# Patient Record
Sex: Male | Born: 1950 | Race: White | Hispanic: No
Health system: Midwestern US, Community
[De-identification: ages and names within clinical notes are randomized; demographics above are authoritative.]

## PROBLEM LIST (undated history)

## (undated) DIAGNOSIS — R252 Cramp and spasm: Secondary | ICD-10-CM

## (undated) DIAGNOSIS — L299 Pruritus, unspecified: Secondary | ICD-10-CM

## (undated) DIAGNOSIS — F419 Anxiety disorder, unspecified: Principal | ICD-10-CM

## (undated) DIAGNOSIS — E538 Deficiency of other specified B group vitamins: Secondary | ICD-10-CM

## (undated) DIAGNOSIS — I10 Essential (primary) hypertension: Secondary | ICD-10-CM

## (undated) DIAGNOSIS — K50818 Crohn's disease of both small and large intestine with other complication: Principal | ICD-10-CM

## (undated) DIAGNOSIS — T148XXA Other injury of unspecified body region, initial encounter: Secondary | ICD-10-CM

## (undated) DIAGNOSIS — F5101 Primary insomnia: Principal | ICD-10-CM

## (undated) DIAGNOSIS — R319 Hematuria, unspecified: Secondary | ICD-10-CM

## (undated) DIAGNOSIS — Z125 Encounter for screening for malignant neoplasm of prostate: Secondary | ICD-10-CM

## (undated) DIAGNOSIS — G4700 Insomnia, unspecified: Secondary | ICD-10-CM

## (undated) DIAGNOSIS — M79672 Pain in left foot: Secondary | ICD-10-CM

## (undated) DIAGNOSIS — K5090 Crohn's disease, unspecified, without complications: Secondary | ICD-10-CM

---

## 1980-09-28 HISTORY — PX: OTHER SURGICAL HISTORY: SHX169

## 2013-09-28 HISTORY — PX: CHOLECYSTECTOMY: SHX55

## 2017-10-29 ENCOUNTER — Encounter (HOSPITAL_COMMUNITY): Payer: Self-pay | Admitting: *Deleted

## 2017-10-29 ENCOUNTER — Other Ambulatory Visit: Payer: Self-pay

## 2017-10-29 ENCOUNTER — Emergency Department (HOSPITAL_COMMUNITY): Payer: Medicare Other

## 2017-10-29 ENCOUNTER — Inpatient Hospital Stay (HOSPITAL_COMMUNITY)
Admission: EM | Admit: 2017-10-29 | Discharge: 2017-11-01 | DRG: 387 | Disposition: A | Discharge status: Home / Self Care | Payer: Medicare Other | Attending: Internal Medicine | Admitting: Internal Medicine

## 2017-10-29 ENCOUNTER — Inpatient Hospital Stay (HOSPITAL_COMMUNITY): Payer: Medicare Other

## 2017-10-29 DIAGNOSIS — K5090 Crohn's disease, unspecified, without complications: Secondary | ICD-10-CM | POA: Diagnosis not present

## 2017-10-29 DIAGNOSIS — Z9049 Acquired absence of other specified parts of digestive tract: Secondary | ICD-10-CM | POA: Diagnosis not present

## 2017-10-29 DIAGNOSIS — Z79899 Other long term (current) drug therapy: Secondary | ICD-10-CM | POA: Diagnosis not present

## 2017-10-29 DIAGNOSIS — E876 Hypokalemia: Secondary | ICD-10-CM | POA: Diagnosis present

## 2017-10-29 DIAGNOSIS — K50012 Crohn's disease of small intestine with intestinal obstruction: Secondary | ICD-10-CM | POA: Diagnosis not present

## 2017-10-29 DIAGNOSIS — K56600 Partial intestinal obstruction, unspecified as to cause: Secondary | ICD-10-CM | POA: Diagnosis not present

## 2017-10-29 DIAGNOSIS — J101 Influenza due to other identified influenza virus with other respiratory manifestations: Secondary | ICD-10-CM

## 2017-10-29 DIAGNOSIS — J069 Acute upper respiratory infection, unspecified: Secondary | ICD-10-CM

## 2017-10-29 DIAGNOSIS — I10 Essential (primary) hypertension: Secondary | ICD-10-CM | POA: Diagnosis present

## 2017-10-29 HISTORY — DX: Essential (primary) hypertension: I10

## 2017-10-29 HISTORY — DX: Crohn's disease, unspecified, without complications: K50.90

## 2017-10-29 LAB — CBC WITH DIFFERENTIAL/PLATELET
Basophils Absolute: 0 10*3/uL (ref 0.0–0.1)
Basophils Relative: 0 %
Eosinophils Absolute: 0.1 10*3/uL (ref 0.0–0.7)
Eosinophils Relative: 1 %
HCT: 37.8 % — ABNORMAL LOW (ref 39.0–52.0)
Hemoglobin: 13.3 g/dL (ref 13.0–17.0)
Lymphocytes Relative: 7 %
Lymphs Abs: 0.6 10*3/uL — ABNORMAL LOW (ref 0.7–4.0)
MCH: 32.4 pg (ref 26.0–34.0)
MCHC: 35.2 g/dL (ref 30.0–36.0)
MCV: 92 fL (ref 78.0–100.0)
Monocytes Absolute: 0.9 10*3/uL (ref 0.1–1.0)
Monocytes Relative: 11 %
Neutro Abs: 7.2 10*3/uL (ref 1.7–7.7)
Neutrophils Relative %: 81 %
Platelets: 258 10*3/uL (ref 150–400)
RBC: 4.11 MIL/uL — ABNORMAL LOW (ref 4.22–5.81)
RDW: 12.9 % (ref 11.5–15.5)
WBC: 8.8 10*3/uL (ref 4.0–10.5)

## 2017-10-29 LAB — COMPREHENSIVE METABOLIC PANEL
ALK PHOS: 56 U/L (ref 38–126)
ALT: 17 U/L (ref 17–63)
AST: 21 U/L (ref 15–41)
Albumin: 3.8 g/dL (ref 3.5–5.0)
Anion gap: 9 (ref 5–15)
BUN: 15 mg/dL (ref 6–20)
CALCIUM: 8.7 mg/dL — AB (ref 8.9–10.3)
CO2: 26 mmol/L (ref 22–32)
CREATININE: 0.79 mg/dL (ref 0.61–1.24)
Chloride: 105 mmol/L (ref 101–111)
Glucose, Bld: 130 mg/dL — ABNORMAL HIGH (ref 65–99)
Potassium: 3.3 mmol/L — ABNORMAL LOW (ref 3.5–5.1)
Sodium: 140 mmol/L (ref 135–145)
TOTAL PROTEIN: 6.8 g/dL (ref 6.5–8.1)
Total Bilirubin: 0.7 mg/dL (ref 0.3–1.2)

## 2017-10-29 LAB — RESPIRATORY PANEL BY PCR
ADENOVIRUS-RVPPCR: NOT DETECTED
Bordetella pertussis: NOT DETECTED
CHLAMYDOPHILA PNEUMONIAE-RVPPCR: NOT DETECTED
CORONAVIRUS OC43-RVPPCR: NOT DETECTED
Coronavirus 229E: NOT DETECTED
Coronavirus HKU1: NOT DETECTED
Coronavirus NL63: NOT DETECTED
INFLUENZA A-RVPPCR: UNDETERMINED — AB
INFLUENZA B-RVPPCR: NOT DETECTED
MYCOPLASMA PNEUMONIAE-RVPPCR: NOT DETECTED
Metapneumovirus: NOT DETECTED
PARAINFLUENZA VIRUS 1-RVPPCR: NOT DETECTED
PARAINFLUENZA VIRUS 4-RVPPCR: NOT DETECTED
Parainfluenza Virus 2: NOT DETECTED
Parainfluenza Virus 3: NOT DETECTED
RESPIRATORY SYNCYTIAL VIRUS-RVPPCR: NOT DETECTED
Rhinovirus / Enterovirus: NOT DETECTED

## 2017-10-29 LAB — PROCALCITONIN: Procalcitonin: <0.1 ng/mL

## 2017-10-29 LAB — HEMOGLOBIN A1C
HEMOGLOBIN A1C: 5.4 % (ref 4.8–5.6)
MEAN PLASMA GLUCOSE: 108.28 mg/dL

## 2017-10-29 LAB — MAGNESIUM: Magnesium: 1.6 mg/dL — ABNORMAL LOW (ref 1.7–2.4)

## 2017-10-29 LAB — I-STAT CG4 LACTIC ACID, ED: LACTIC ACID, VENOUS: 0.92 mmol/L (ref 0.5–1.9)

## 2017-10-29 LAB — LIPASE, BLOOD: LIPASE: 30 U/L (ref 11–51)

## 2017-10-29 LAB — GLUCOSE, CAPILLARY
GLUCOSE-CAPILLARY: 98 mg/dL (ref 65–99)
GLUCOSE-CAPILLARY: 107 mg/dL — AB (ref 65–99)

## 2017-10-29 MED ORDER — LEVALBUTEROL HCL 0.63 MG/3ML IN NEBU: 0.6300 mg | INHALATION_SOLUTION | Freq: Four times a day (QID) | RESPIRATORY_TRACT | Status: DC

## 2017-10-29 MED ORDER — SODIUM CHLORIDE 0.9 % IV BOLUS (SEPSIS)
1000.0000 mL | Freq: Once | INTRAVENOUS | Status: AC
  Administered 2017-10-29: 1000 mL via INTRAVENOUS

## 2017-10-29 MED ORDER — HYDROMORPHONE HCL 1 MG/ML IJ SOLN
1.0000 mg | Freq: Once | INTRAMUSCULAR | Status: AC
  Administered 2017-10-29: 1 mg via INTRAVENOUS
  Filled 2017-10-29: qty 1

## 2017-10-29 MED ORDER — METRONIDAZOLE IN NACL 5-0.79 MG/ML-% IV SOLN
500.0000 mg | Freq: Three times a day (TID) | INTRAVENOUS | Status: DC
  Administered 2017-10-29 – 2017-10-31 (×6): 500 mg via INTRAVENOUS
  Filled 2017-10-29 (×7): qty 100

## 2017-10-29 MED ORDER — ONDANSETRON HCL 4 MG/2ML IJ SOLN
4.0000 mg | Freq: Once | INTRAMUSCULAR | Status: AC
  Administered 2017-10-29: 4 mg via INTRAVENOUS
  Filled 2017-10-29: qty 2

## 2017-10-29 MED ORDER — INSULIN ASPART 100 UNIT/ML ~~LOC~~ SOLN: 0.0000 [IU] | Freq: Three times a day (TID) | SUBCUTANEOUS | Status: DC

## 2017-10-29 MED ORDER — ONDANSETRON HCL 4 MG/2ML IJ SOLN
4.0000 mg | Freq: Four times a day (QID) | INTRAMUSCULAR | Status: DC | PRN
  Administered 2017-10-29 – 2017-10-31 (×5): 4 mg via INTRAVENOUS
  Filled 2017-10-29 (×5): qty 2

## 2017-10-29 MED ORDER — METHYLPREDNISOLONE SODIUM SUCC 40 MG IJ SOLR
40.0000 mg | Freq: Two times a day (BID) | INTRAMUSCULAR | Status: DC
  Administered 2017-10-29 – 2017-10-31 (×4): 40 mg via INTRAVENOUS
  Filled 2017-10-29 (×4): qty 1

## 2017-10-29 MED ORDER — ACETAMINOPHEN 325 MG PO TABS
650.0000 mg | ORAL_TABLET | Freq: Four times a day (QID) | ORAL | Status: DC | PRN
Start: 2017-10-29 — End: 2017-11-01
  Administered 2017-10-31: 650 mg via ORAL
  Filled 2017-10-29 (×2): qty 2

## 2017-10-29 MED ORDER — ONDANSETRON HCL 4 MG PO TABS
4.0000 mg | ORAL_TABLET | Freq: Four times a day (QID) | ORAL | Status: DC | PRN
  Administered 2017-11-01: 4 mg via ORAL
  Filled 2017-10-29: qty 1

## 2017-10-29 MED ORDER — IOPAMIDOL (ISOVUE-300) INJECTION 61%
100.0000 mL | Freq: Once | INTRAVENOUS | Status: AC | PRN
  Administered 2017-10-29: 100 mL via INTRAVENOUS

## 2017-10-29 MED ORDER — LORAZEPAM 2 MG/ML IJ SOLN
1.0000 mg | Freq: Once | INTRAMUSCULAR | Status: AC
  Administered 2017-10-29: 1 mg via INTRAVENOUS
  Filled 2017-10-29: qty 1

## 2017-10-29 MED ORDER — ACETAMINOPHEN 650 MG RE SUPP: 650.0000 mg | Freq: Four times a day (QID) | RECTAL | Status: DC | PRN

## 2017-10-29 MED ORDER — IOPAMIDOL (ISOVUE-300) INJECTION 61%
INTRAVENOUS | Status: AC
  Administered 2017-10-29: 11:00:00
  Filled 2017-10-29: qty 100

## 2017-10-29 MED ORDER — POTASSIUM CHLORIDE IN NACL 40-0.9 MEQ/L-% IV SOLN
INTRAVENOUS | Status: DC
  Administered 2017-10-29 – 2017-10-30 (×2): 100 mL/h via INTRAVENOUS
  Administered 2017-10-31 (×2): 50 mL/h via INTRAVENOUS
  Filled 2017-10-29 (×5): qty 1000

## 2017-10-29 MED ORDER — HYDROMORPHONE HCL 1 MG/ML IJ SOLN
1.0000 mg | INTRAMUSCULAR | Status: DC | PRN
  Administered 2017-10-29 – 2017-11-01 (×10): 1 mg via INTRAVENOUS
  Filled 2017-10-29 (×10): qty 1

## 2017-10-29 MED ORDER — CIPROFLOXACIN IN D5W 400 MG/200ML IV SOLN
400.0000 mg | Freq: Two times a day (BID) | INTRAVENOUS | Status: DC
  Administered 2017-10-29 – 2017-10-31 (×4): 400 mg via INTRAVENOUS
  Filled 2017-10-29 (×4): qty 200

## 2017-10-29 MED ORDER — ENOXAPARIN SODIUM 40 MG/0.4ML ~~LOC~~ SOLN
40.0000 mg | SUBCUTANEOUS | Status: DC
  Administered 2017-10-29 – 2017-10-31 (×3): 40 mg via SUBCUTANEOUS
  Filled 2017-10-29 (×3): qty 0.4

## 2017-10-29 MED ORDER — MAGNESIUM SULFATE 2 GM/50ML IV SOLN
2.0000 g | Freq: Once | INTRAVENOUS | Status: AC
  Administered 2017-10-29: 2 g via INTRAVENOUS
  Filled 2017-10-29: qty 50

## 2017-10-29 MED ORDER — METOCLOPRAMIDE HCL 5 MG/ML IJ SOLN
10.0000 mg | Freq: Once | INTRAMUSCULAR | Status: AC
  Administered 2017-10-29: 10 mg via INTRAVENOUS
  Filled 2017-10-29: qty 2

## 2017-10-29 MED ORDER — LEVALBUTEROL HCL 0.63 MG/3ML IN NEBU: 0.6300 mg | INHALATION_SOLUTION | Freq: Four times a day (QID) | RESPIRATORY_TRACT | Status: DC | PRN

## 2017-10-29 MED ORDER — BISACODYL 5 MG PO TBEC: 5.0000 mg | DELAYED_RELEASE_TABLET | Freq: Every day | ORAL | Status: DC | PRN

## 2017-10-29 NOTE — ED Notes (Signed)
Attempted to help pt on bedpan due to the fact that pt called out for restroom assistance and stated he couldn't walk. Pt stated  He wasn't going to use bedpan bc " it's nasty." pt and family member then said they wanted a RN to help him. I left the room and notified RN that she was requested in the room.

## 2017-10-29 NOTE — ED Notes (Signed)
Patient transported to X-ray 

## 2017-10-29 NOTE — ED Notes (Signed)
Bed: ZO10 Expected date:  Expected time:  Means of arrival:  Comments: Emesis with flu-like s/sx

## 2017-10-29 NOTE — ED Notes (Signed)
CT started new IV in right AC. CT stated they were readjusting IV and patient jerked away and pulled IV out.

## 2017-10-29 NOTE — ED Notes (Signed)
Patient transported to CT 

## 2017-10-29 NOTE — ED Provider Notes (Signed)
[facility]-EMERGENCY DEPT Provider Note   CSN: [id number] Arrival date & time: 10/29/17  4098     History   Chief Complaint Chief Complaint  Patient presents with  . Emesis    flu like symptoms    HPI Kenneth Mcintyre is a 67 y.o. male.  HPI 67 year old male with a history of Crohn's disease who presents to the emergency department with complaints of abdominal pain nausea vomiting diarrhea since last night.  He reports inability to keep down fluids today.  His pain is severe in severity and located diffusely around his abdomen.  He had a prior small bowel resection 30+ years ago.  His GI team is in [facility].  He is currently visiting friends here in the [place] region.        Home Medications    Prior to Admission medications   Medication Sig Start Date End Date Taking? Authorizing Provider  amLODipine-valsartan (EXFORGE) 10-320 MG tablet Take 0.5 tablets by mouth daily.   Yes [provider]  Melatonin 5 MG TABS Take 1 tablet by mouth once.   Yes [provider]  oxyCODONE-acetaminophen (PERCOCET) 10-325 MG tablet Take 1 tablet by mouth every 6 (six) hours as needed for pain.   Yes [provider]  Past medical history: Crohn's disease  Family History Noncontributory  Social History Denies drug abuse   Allergies   Patient has no known allergies.   Review of Systems Review of Systems  All other systems reviewed and are negative.    Physical Exam Updated Vital Signs BP (!) 142/53 (BP Location: Right Arm)   Pulse 80   Temp 99.1 F (37.3 C) (Oral)   Resp 16   SpO2 93%   Physical Exam  Constitutional: He is oriented to person, place, and time. He appears well-developed and well-nourished.  Uncomfortable appearing  HENT:  Head: Normocephalic and atraumatic.  Eyes: EOM are normal.  Neck: Normal range of motion.  Cardiovascular: Normal rate, regular rhythm, normal heart sounds and intact  distal pulses.  Pulmonary/Chest: Effort normal and breath sounds normal. No respiratory distress.  Abdominal: Soft. He exhibits no distension.  Diffuse generalized abdominal tenderness without guarding or rebound.  No peritoneal signs.  Musculoskeletal: Normal range of motion.  Neurological: He is alert and oriented to person, place, and time.  Skin: Skin is warm and dry.  Psychiatric: He has a normal mood and affect. Judgment normal.  Nursing note and vitals reviewed.    ED Treatments / Results  Labs (all labs ordered are listed, but only abnormal results are displayed) Labs Reviewed  CBC WITH DIFFERENTIAL/PLATELET - Abnormal; Notable for the following components:      Result Value   RBC 4.11 (*)    HCT 37.8 (*)    Lymphs Abs 0.6 (*)    All other components within normal limits  COMPREHENSIVE METABOLIC PANEL - Abnormal; Notable for the following components:   Potassium 3.3 (*)    Glucose, Bld 130 (*)    Calcium 8.7 (*)    All other components within normal limits  LIPASE, BLOOD  MAGNESIUM  I-STAT CG4 LACTIC ACID, ED    EKG  EKG Interpretation None       Radiology Ct Abdomen Pelvis W Contrast  Result Date: 10/29/2017 CLINICAL DATA:  Flu like symptoms. Elevated blood glucose. Vomiting. Chills. Crohn disease. EXAM: CT ABDOMEN AND PELVIS WITH CONTRAST TECHNIQUE: Multidetector CT imaging of the abdomen and pelvis was performed using the standard protocol following  bolus administration of intravenous contrast. CONTRAST:  ISOVUE-300 IOPAMIDOL (ISOVUE-300) INJECTION 61% COMPARISON:  None. FINDINGS: Lower chest: Clear lung bases. Normal heart size without pericardial or pleural effusion. Hepatobiliary: Normal liver. Cholecystectomy, without biliary ductal dilatation. Pancreas: Normal, without mass or ductal dilatation. Spleen: Normal in size, without focal abnormality. Adrenals/Urinary Tract: Mild left adrenal thickening. Normal right adrenal gland. Normal right kidney.  Interpolar left renal lesion of 1.4 cm measures fluid density, most consistent with a cyst. No hydronephrosis. Normal urinary bladder. Stomach/Bowel: Underdistention of the gastric antrum. Portions of the sigmoid are underdistended. Apparent wall thickening is felt to be secondary. Mild inflammation of the right-side of the colon, as evidenced by mucosal hyperenhancement. The colon is primarily fluid-filled, consistent with the clinical history of diarrhea. The mid to distal ileum is focally dilated, including at 4.5 cm on image 60/series 2. This continues to the level of an area of distal ileal wall thickening and submucosal fat prominence, including on image 52/series 2. The terminal most ileum (likely the neoterminal ileum) demonstrates wall thickening and subtle perienteric interstitial thickening, including on image 48/series 2. Surgical changes in the region of the ileocecal junction. The remainder of the more proximal small bowel is normal in caliber and morphology. Vascular/Lymphatic: Aortic and branch vessel atherosclerosis. No abdominopelvic adenopathy. Reproductive: Normal prostate. Other: No significant free fluid. No free intraperitoneal air. Tiny bilateral fat containing inguinal hernias. Musculoskeletal: Degenerate disc disease at L4-5. IMPRESSION: 1. Abnormal appearance of the distal and neoterminal ileum, in this patient who has undergone prior surgical changes at the ileocecal junction. The distal ileal process is primarily favored to represent chronic/fibrostenotic Crohn disease with a component of mild upstream dilatation/obstruction. Suspect superimposed acute inflammation at the neoterminal ileum. 2. Right-sided colitis is also likely related to inflammation. Apparent sigmoid wall thickening is at least partially due to underdistention. 3. No abscess or other acute complication. 4.  Aortic Atherosclerosis (ICD10-I70.0). Electronically Signed   By: Jeronimo Greaves M.D.   On: 10/29/2017 12:04     Procedures Procedures (including critical care time)  Medications Ordered in ED Medications  sodium chloride 0.9 % bolus 1,000 mL (0 mLs Intravenous Stopped 10/29/17 1417)  HYDROmorphone (DILAUDID) injection 1 mg (1 mg Intravenous Given 10/29/17 1019)  ondansetron (ZOFRAN) injection 4 mg (4 mg Intravenous Given 10/29/17 1030)  iopamidol (ISOVUE-300) 61 % injection 100 mL (100 mLs Intravenous Contrast Given 10/29/17 1109)  iopamidol (ISOVUE-300) 61 % injection (  Contrast Given 10/29/17 1115)  HYDROmorphone (DILAUDID) injection 1 mg (1 mg Intravenous Given 10/29/17 1253)  metoCLOPramide (REGLAN) injection 10 mg (10 mg Intravenous Given 10/29/17 1253)     Initial Impression / Assessment and Plan / ED Course  I have reviewed the triage vital signs and the nursing notes.  Pertinent labs & imaging results that were available during my care of the patient were reviewed by me and considered in my medical decision making (see chart for details).    Appears to be a Crohn's exacerbation.  There is a questionable area of stenosis resulting in some proximal small bowel obstruction.  Given the persistence and the patient's pain in his persistant nausea vomiting in the emergency department NG tube will be placed for decompression of his upper GI system.  Admit now.     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  

## 2017-10-29 NOTE — ED Notes (Signed)
(  [phone] [name]. Patients wife. 37 years ago he had 18 inches of his intestines removed. Patient has had chron's for several years. Per wife patient has been under a lot of stress and has chron's flare up.

## 2017-10-29 NOTE — ED Triage Notes (Addendum)
Patient arrived via [facility] from home. fluid given per EMS. Patient c/o of flu like symptoms. Patient has not seen a doctor in 5 years. Sugar of 214. Patient has vomited about 15 times since last night. Patient c/o of chills. Patient takes oxy for chronic pain. Patient asking for pain medication per EMS. Patient has a hx. Of chrons.

## 2017-10-29 NOTE — H&P (Addendum)
[facility] History and Physical  Kenneth Mcintyre WRU:[id number] DOB: [birth year] DOA: 10/29/2017  Referring physician:   PCP: System, Provider Not In   Chief Complaint:  Multiple episodes of vomiting  HPI:   67 year old male with a history of chronic disease, not on any immunologic therapy currently, [ insurance denied Humira], followed by gastroenterologist in [place], who presents today with intractable nausea, nonbilious vomiting, nonbloody diarrhea for the last 24 hours. Inability to keep any fluids down. Complaining of   diffuse abdominal pain.  In addition he's had URI-like symptoms   Line symptoms of cough congestion and sore throat.   ED course BP (!) 142/53 (BP Location: Right Arm)   Pulse 80   Temp 99.1 F (37.3 C) (Oral)   Resp 16   SpO2 93%  CT abdomen pelvis showed proximal small bowel obstruction, previous surgical changes at the ileocecal junction, superimposed acute inflammation likely right-sided colitis. NG tube ordered. Chest x-ray pending. Patient started on empiric antibiotics for colitis and IV Solu-Medrol. Admitted for colitis and Crohn's exacerbation      Review of Systems: negative for the following   Complete review of systems was done Pertinent positives as documented in history of present illness  Medical history Crohn's disease Hypertension     surgical history Ileocecal resection   Social History:  has no tobacco, alcohol, and drug history on file.    No Known Allergies      FAMILY HISTORY  When questioned  Directly-patient reports  No family history of HTN, CVA ,DIABETES, TB, Cancer CAD, Bleeding Disorders, Sickle Cell, diabetes, anemia, asthma,   Prior to Admission medications   Medication Sig Start Date End Date Taking? Authorizing Provider  amLODipine-valsartan (EXFORGE) 10-320 MG tablet Take 0.5 tablets by mouth daily.   Yes [provider]  Melatonin 5 MG TABS Take 1 tablet by mouth once.   Yes [provider]  oxyCODONE-acetaminophen (PERCOCET) 10-325 MG tablet Take 1 tablet by mouth every 6 (six) hours as needed for pain.   Yes [provider]     Physical Exam: Vitals:   10/29/17 1256 10/29/17 1300 10/29/17 1330 10/29/17 1400  BP: (!) 151/67 (!) 168/67 (!) 155/61 (!) 142/53  Pulse: 83 83 79 80  Resp: 18  18 16   Temp:      TempSrc:      SpO2: 93% 91% 93% 93%        Vitals:   10/29/17 1256 10/29/17 1300 10/29/17 1330 10/29/17 1400  BP: (!) 151/67 (!) 168/67 (!) 155/61 (!) 142/53  Pulse: 83 83 79 80  Resp: 18  18 16   Temp:      TempSrc:      SpO2: 93% 91% 93% 93%   Constitutional:  Appears to be in mild-to-moderate distress Eyes: PERRL, lids and conjunctivae normal ENMT: Mucous membranes are moist. Posterior pharynx clear of any exudate or lesions.Normal dentition.  Neck: normal, supple, no masses, no thyromegaly Respiratory: clear to auscultation bilaterally, no wheezing, no crackles. Normal respiratory effort. No accessory muscle use.  Cardiovascular: Regular rate and rhythm, no murmurs / rubs / gallops. No extremity edema. 2+ pedal pulses. No carotid bruits.  Abdomen: Diffusely tender to palpation, no rebound or guarding , decreased bowel sounds Musculoskeletal: no clubbing / cyanosis. No joint deformity upper and lower extremities. Good ROM, no contractures. Normal muscle tone.  Skin: no rashes, lesions, ulcers. No induration Neurologic: CN 2-12 grossly intact. Sensation intact, DTR normal. Strength 5/5 in all 4.  Psychiatric: Normal  judgment and insight. Alert and oriented x 3. Normal mood.     Labs on Admission: I have personally reviewed following labs and imaging studies  CBC: Recent Labs  Lab 10/29/17 1008  WBC 8.8  NEUTROABS 7.2  HGB 13.3  HCT 37.8*  MCV 92.0  PLT 258    Basic Metabolic Panel: Recent Labs  Lab 10/29/17 1008  NA 140  K 3.3*  CL 105  CO2 26  GLUCOSE 130*  BUN 15  CREATININE 0.79  CALCIUM 8.7*    GFR: CrCl  cannot be calculated (Unknown ideal weight.).  Liver Function Tests: Recent Labs  Lab 10/29/17 1008  AST 21  ALT 17  ALKPHOS 56  BILITOT 0.7  PROT 6.8  ALBUMIN 3.8   Recent Labs  Lab 10/29/17 1008  LIPASE 30   No results for input(s): AMMONIA in the last 168 hours.  Coagulation Profile: No results for input(s): INR, PROTIME in the last 168 hours. No results for input(s): DDIMER in the last 72 hours.  Cardiac Enzymes: No results for input(s): CKTOTAL, CKMB, CKMBINDEX, TROPONINI in the last 168 hours.  BNP (last 3 results) No results for input(s): PROBNP in the last 8760 hours.  HbA1C: No results for input(s): HGBA1C in the last 72 hours. No results found for: HGBA1C   CBG: No results for input(s): GLUCAP in the last 168 hours.  Lipid Profile: No results for input(s): CHOL, HDL, LDLCALC, TRIG, CHOLHDL, LDLDIRECT in the last 72 hours.  Thyroid Function Tests: No results for input(s): TSH, T4TOTAL, FREET4, T3FREE, THYROIDAB in the last 72 hours.  Anemia Panel: No results for input(s): VITAMINB12, FOLATE, FERRITIN, TIBC, IRON, RETICCTPCT in the last 72 hours.  Urine analysis: No results found for: COLORURINE, APPEARANCEUR, LABSPEC, PHURINE, GLUCOSEU, HGBUR, BILIRUBINUR, KETONESUR, PROTEINUR, UROBILINOGEN, NITRITE, LEUKOCYTESUR  Sepsis Labs: @LABRCNTIP (procalcitonin:4,lacticidven:4) )No results found for this or any previous visit (from the past 240 hour(s)).       Radiological Exams on Admission: Ct Abdomen Pelvis W Contrast  Result Date: 10/29/2017 CLINICAL DATA:  Flu like symptoms. Elevated blood glucose. Vomiting. Chills. Crohn disease. EXAM: CT ABDOMEN AND PELVIS WITH CONTRAST TECHNIQUE: Multidetector CT imaging of the abdomen and pelvis was performed using the standard protocol following bolus administration of intravenous contrast. CONTRAST:  ISOVUE-300 IOPAMIDOL (ISOVUE-300) INJECTION 61% COMPARISON:  None. FINDINGS: Lower chest: Clear lung bases.  Normal heart size without pericardial or pleural effusion. Hepatobiliary: Normal liver. Cholecystectomy, without biliary ductal dilatation. Pancreas: Normal, without mass or ductal dilatation. Spleen: Normal in size, without focal abnormality. Adrenals/Urinary Tract: Mild left adrenal thickening. Normal right adrenal gland. Normal right kidney. Interpolar left renal lesion of 1.4 cm measures fluid density, most consistent with a cyst. No hydronephrosis. Normal urinary bladder. Stomach/Bowel: Underdistention of the gastric antrum. Portions of the sigmoid are underdistended. Apparent wall thickening is felt to be secondary. Mild inflammation of the right-side of the colon, as evidenced by mucosal hyperenhancement. The colon is primarily fluid-filled, consistent with the clinical history of diarrhea. The mid to distal ileum is focally dilated, including at 4.5 cm on image 60/series 2. This continues to the level of an area of distal ileal wall thickening and submucosal fat prominence, including on image 52/series 2. The terminal most ileum (likely the neoterminal ileum) demonstrates wall thickening and subtle perienteric interstitial thickening, including on image 48/series 2. Surgical changes in the region of the ileocecal junction. The remainder of the more proximal small bowel is normal in caliber and morphology. Vascular/Lymphatic: Aortic and branch vessel  atherosclerosis. No abdominopelvic adenopathy. Reproductive: Normal prostate. Other: No significant free fluid. No free intraperitoneal air. Tiny bilateral fat containing inguinal hernias. Musculoskeletal: Degenerate disc disease at L4-5. IMPRESSION: 1. Abnormal appearance of the distal and neoterminal ileum, in this patient who has undergone prior surgical changes at the ileocecal junction. The distal ileal process is primarily favored to represent chronic/fibrostenotic Crohn disease with a component of mild upstream dilatation/obstruction. Suspect superimposed  acute inflammation at the neoterminal ileum. 2. Right-sided colitis is also likely related to inflammation. Apparent sigmoid wall thickening is at least partially due to underdistention. 3. No abscess or other acute complication. 4.  Aortic Atherosclerosis (ICD10-I70.0). Electronically Signed   By: Jeronimo Greaves M.D.   On: 10/29/2017 12:04   Ct Abdomen Pelvis W Contrast  Result Date: 10/29/2017 CLINICAL DATA:  Flu like symptoms. Elevated blood glucose. Vomiting. Chills. Crohn disease. EXAM: CT ABDOMEN AND PELVIS WITH CONTRAST TECHNIQUE: Multidetector CT imaging of the abdomen and pelvis was performed using the standard protocol following bolus administration of intravenous contrast. CONTRAST:  ISOVUE-300 IOPAMIDOL (ISOVUE-300) INJECTION 61% COMPARISON:  None. FINDINGS: Lower chest: Clear lung bases. Normal heart size without pericardial or pleural effusion. Hepatobiliary: Normal liver. Cholecystectomy, without biliary ductal dilatation. Pancreas: Normal, without mass or ductal dilatation. Spleen: Normal in size, without focal abnormality. Adrenals/Urinary Tract: Mild left adrenal thickening. Normal right adrenal gland. Normal right kidney. Interpolar left renal lesion of 1.4 cm measures fluid density, most consistent with a cyst. No hydronephrosis. Normal urinary bladder. Stomach/Bowel: Underdistention of the gastric antrum. Portions of the sigmoid are underdistended. Apparent wall thickening is felt to be secondary. Mild inflammation of the right-side of the colon, as evidenced by mucosal hyperenhancement. The colon is primarily fluid-filled, consistent with the clinical history of diarrhea. The mid to distal ileum is focally dilated, including at 4.5 cm on image 60/series 2. This continues to the level of an area of distal ileal wall thickening and submucosal fat prominence, including on image 52/series 2. The terminal most ileum (likely the neoterminal ileum) demonstrates wall thickening and subtle  perienteric interstitial thickening, including on image 48/series 2. Surgical changes in the region of the ileocecal junction. The remainder of the more proximal small bowel is normal in caliber and morphology. Vascular/Lymphatic: Aortic and branch vessel atherosclerosis. No abdominopelvic adenopathy. Reproductive: Normal prostate. Other: No significant free fluid. No free intraperitoneal air. Tiny bilateral fat containing inguinal hernias. Musculoskeletal: Degenerate disc disease at L4-5. IMPRESSION: 1. Abnormal appearance of the distal and neoterminal ileum, in this patient who has undergone prior surgical changes at the ileocecal junction. The distal ileal process is primarily favored to represent chronic/fibrostenotic Crohn disease with a component of mild upstream dilatation/obstruction. Suspect superimposed acute inflammation at the neoterminal ileum. 2. Right-sided colitis is also likely related to inflammation. Apparent sigmoid wall thickening is at least partially due to underdistention. 3. No abscess or other acute complication. 4.  Aortic Atherosclerosis (ICD10-I70.0). Electronically Signed   By: Jeronimo Greaves M.D.   On: 10/29/2017 12:04      EKG: Independently reviewed.   Assessment/Plan Principal Problem:   Exacerbation of Crohn's disease (HCC)/partial small bowel obstruction in the setting of previous surgical changes at  ileo cecal  junction Right-sided colitis likely secondary to inflammation, infection not ruled out Patient admitted for the above findings Patient may have partial small bowel obstruction versus ileus due to electrolyte abnormalities and inflammation Empiric antibiotics-ciprofloxacin and Flagyl IV IV Solu-Medrol GI panel Zofran when necessary for nausea NG tube for decompression NPO,  IVF   Hypokalemia Replete  Hypertension Hold antihypertensive medications for now and monitor blood pressure   cough Rule out aspiration Will order a chest x-ray         DVT prophylaxis:  Lovenox  Code Status History full code    This patient does not have a recorded code status. Please follow your organizational policy for patients in this situation.       consults called: none   Family Communication: Admission, patients condition and plan of care including tests being ordered have been discussed with the patient  who indicates understanding and agree with the plan and Code Status  Admission status: inpatient    Disposition plan: Further plan will depend as patient's clinical course evolves and further radiologic and laboratory data become available. Likely home when stable   At the time of admission, it appears that the appropriate admission status for this patient is INPATIENT .Thisis judged to be reasonable and necessary in order to provide the required intensity of service to ensure the patient's safetygiven thepresenting symptoms, physical exam findings, and initial radiographic and laboratory data in the context of their chronic comorbidities.   Richarda Overlie MD [facility] Pager [phone]  If 7PM-7AM, please contact night-coverage [email or web address] Password [email or web address]  10/29/2017, 3:07 PM

## 2017-10-30 DIAGNOSIS — J101 Influenza due to other identified influenza virus with other respiratory manifestations: Secondary | ICD-10-CM

## 2017-10-30 LAB — COMPREHENSIVE METABOLIC PANEL
ALK PHOS: 41 U/L (ref 38–126)
ALT: 14 U/L — ABNORMAL LOW (ref 17–63)
ANION GAP: 7 (ref 5–15)
AST: 17 U/L (ref 15–41)
Albumin: 3.1 g/dL — ABNORMAL LOW (ref 3.5–5.0)
BUN: 15 mg/dL (ref 6–20)
CHLORIDE: 110 mmol/L (ref 101–111)
CO2: 22 mmol/L (ref 22–32)
Calcium: 7.9 mg/dL — ABNORMAL LOW (ref 8.9–10.3)
Creatinine, Ser: 0.72 mg/dL (ref 0.61–1.24)
GFR calc non Af Amer: >60 mL/min (ref >60)
Glucose, Bld: 117 mg/dL — ABNORMAL HIGH (ref 65–99)
Potassium: 3.5 mmol/L (ref 3.5–5.1)
SODIUM: 139 mmol/L (ref 135–145)
Total Bilirubin: 0.5 mg/dL (ref 0.3–1.2)
Total Protein: 5.8 g/dL — ABNORMAL LOW (ref 6.5–8.1)

## 2017-10-30 LAB — GASTROINTESTINAL PANEL BY PCR, STOOL (REPLACES STOOL CULTURE)
ASTROVIRUS: NOT DETECTED
Adenovirus F40/41: NOT DETECTED
Campylobacter species: NOT DETECTED
Cryptosporidium: NOT DETECTED
Cyclospora cayetanensis: NOT DETECTED
ENTAMOEBA HISTOLYTICA: NOT DETECTED
ENTEROAGGREGATIVE E COLI (EAEC): NOT DETECTED
ENTEROTOXIGENIC E COLI (ETEC): NOT DETECTED
Enteropathogenic E coli (EPEC): NOT DETECTED
GIARDIA LAMBLIA: NOT DETECTED
NOROVIRUS GI/GII: NOT DETECTED
Plesimonas shigelloides: NOT DETECTED
Rotavirus A: NOT DETECTED
Salmonella species: NOT DETECTED
Sapovirus (I, II, IV, and V): NOT DETECTED
Shiga like toxin producing E coli (STEC): NOT DETECTED
Shigella/Enteroinvasive E coli (EIEC): NOT DETECTED
VIBRIO CHOLERAE: NOT DETECTED
Vibrio species: NOT DETECTED
Yersinia enterocolitica: NOT DETECTED

## 2017-10-30 LAB — CBC
HCT: 35.1 % — ABNORMAL LOW (ref 39.0–52.0)
HEMOGLOBIN: 12.1 g/dL — AB (ref 13.0–17.0)
MCH: 31.9 pg (ref 26.0–34.0)
MCHC: 34.5 g/dL (ref 30.0–36.0)
MCV: 92.6 fL (ref 78.0–100.0)
Platelets: 219 10*3/uL (ref 150–400)
RBC: 3.79 MIL/uL — AB (ref 4.22–5.81)
RDW: 13.1 % (ref 11.5–15.5)
WBC: 5.8 10*3/uL (ref 4.0–10.5)

## 2017-10-30 LAB — GLUCOSE, CAPILLARY
GLUCOSE-CAPILLARY: 122 mg/dL — AB (ref 65–99)
GLUCOSE-CAPILLARY: 101 mg/dL — AB (ref 65–99)
Glucose-Capillary: 106 mg/dL — ABNORMAL HIGH (ref 65–99)
Glucose-Capillary: 123 mg/dL — ABNORMAL HIGH (ref 65–99)

## 2017-10-30 LAB — INFLUENZA PANEL BY PCR (TYPE A & B)
INFLBPCR: NEGATIVE
Influenza A By PCR: POSITIVE — AB
Influenza A By PCR: POSITIVE — AB
Influenza B By PCR: NEGATIVE

## 2017-10-30 MED ORDER — ALPRAZOLAM 0.5 MG PO TABS
0.5000 mg | ORAL_TABLET | Freq: Three times a day (TID) | ORAL | Status: DC | PRN
  Administered 2017-10-30 – 2017-10-31 (×4): 0.5 mg via ORAL
  Filled 2017-10-30 (×4): qty 1

## 2017-10-30 MED ORDER — ALPRAZOLAM 0.5 MG PO TABS
0.5000 mg | ORAL_TABLET | Freq: Two times a day (BID) | ORAL | Status: DC | PRN
  Administered 2017-10-30: 0.5 mg via ORAL
  Filled 2017-10-30: qty 1

## 2017-10-30 MED ORDER — OSELTAMIVIR PHOSPHATE 75 MG PO CAPS
75.0000 mg | ORAL_CAPSULE | Freq: Two times a day (BID) | ORAL | Status: DC
Start: 2017-10-30 — End: 2017-11-01
  Administered 2017-10-30 – 2017-11-01 (×5): 75 mg via ORAL
  Filled 2017-10-30 (×5): qty 1

## 2017-10-30 NOTE — Progress Notes (Signed)
PROGRESS NOTE    Kenneth Mcintyre  RUE:[id number] DOB: [birth year] DOA: 10/29/2017 PCP: System, Provider Not In    Brief Narrative:  67 year old male with a history of chronic disease, not on any immunologic therapy currently, [ insurance denied Humira], followed by gastroenterologist in [place], who presents today with intractable nausea, nonbilious vomiting, nonbloody diarrhea for the last 24 hours. Inability to keep any fluids down. Complaining of   diffuse abdominal pain.  In addition he's had URI-like symptoms   Assessment & Plan:   Principal Problem:   Exacerbation of Crohn's disease (HCC) Active Problems:   Partial small bowel obstruction (HCC)   Influenza A    Exacerbation of Crohn's disease (HCC)/partial small bowel obstruction in the setting of previous surgical changes at  ileo cecal  junction Right-sided colitis likely secondary to inflammation, infection not ruled out Patient had been continued on empiric antibiotics-ciprofloxacin and Flagyl IV with solumedrol Clinical improvement overnight Will start clears and advance diet as tolerated   Hypokalemia Replaced Repeat BMET in AM  Hypertension BP initially soft with bp meds held BP currently stable  Influenza A CXR clear Nasal flu swab positive for flu A Start tamiflu Currently afebrile Continue droplet precautions  DVT prophylaxis: Lovenox Code Status: Full Family Communication: Pt in room, family at bedside Disposition Plan: Uncertain at this time  Consultants:     Procedures:     Antimicrobials: Anti-infectives (From admission, onward)   Start     Dose/Rate Route Frequency Ordered Stop   10/30/17 1445  oseltamivir (TAMIFLU) capsule 75 mg     75 mg Oral 2 times daily 10/30/17 1432 11/04/17 0959   10/29/17 1600  ciprofloxacin (CIPRO) IVPB 400 mg     400 mg 200 mL/hr over 60 Minutes Intravenous Every 12 hours 10/29/17 1501     10/29/17 1600  metroNIDAZOLE (FLAGYL) IVPB 500 mg     500  mg 100 mL/hr over 60 Minutes Intravenous Every 8 hours 10/29/17 1501        Subjective: Reports feeling better today  Objective: Vitals:   10/29/17 1634 10/29/17 2012 10/29/17 2230 10/30/17 0455  BP: 133/69 134/65  (!) 123/55  Pulse: 87 87  72  Resp: 18 16  18   Temp: 99.7 F (37.6 C) 99.4 F (37.4 C) (!) 97.3 F (36.3 C) 98.9 F (37.2 C)  TempSrc: Oral Oral Oral Oral  SpO2: 98% 98%  98%  Weight: 89 kg (196 lb 3.4 oz)     Height: 6' (1.829 m)       Intake/Output Summary (Last 24 hours) at 10/30/2017 1432 Last data filed at 10/30/2017 0600 Gross per 24 hour  Intake 2888.33 ml  Output 600 ml  Net 2288.33 ml   Filed Weights   10/29/17 1634  Weight: 89 kg (196 lb 3.4 oz)    Examination:  General exam: Appears calm and comfortable  Respiratory system: Clear to auscultation. Respiratory effort normal. Cardiovascular system: S1 & S2 heard, RRR. No JVD, murmurs, rubs, gallops or clicks. No pedal edema. Gastrointestinal system: Abdomen is nondistended, soft and nontender. No organomegaly or masses felt. Normal bowel sounds heard. Central nervous system: Alert and oriented. No focal neurological deficits. Extremities: Symmetric 5 x 5 power. Skin: No rashes, lesions or ulcers Psychiatry: Judgement and insight appear normal. Mood & affect appropriate.   Data Reviewed: I have personally reviewed following labs and imaging studies  CBC: Recent Labs  Lab 10/29/17 1008 10/30/17 0640  WBC 8.8 5.8  NEUTROABS 7.2  --  HGB 13.3 12.1*  HCT 37.8* 35.1*  MCV 92.0 92.6  PLT 258 219   Basic Metabolic Panel: Recent Labs  Lab 10/29/17 1008 10/30/17 0640  NA 140 139  K 3.3* 3.5  CL 105 110  CO2 26 22  GLUCOSE 130* 117*  BUN 15 15  CREATININE 0.79 0.72  CALCIUM 8.7* 7.9*  MG 1.6*  --    GFR: Estimated Creatinine Clearance: 99.7 mL/min (by C-G formula based on SCr of 0.72 mg/dL). Liver Function Tests: Recent Labs  Lab 10/29/17 1008 10/30/17 0640  AST 21 17  ALT 17  14*  ALKPHOS 56 41  BILITOT 0.7 0.5  PROT 6.8 5.8*  ALBUMIN 3.8 3.1*   Recent Labs  Lab 10/29/17 1008  LIPASE 30   No results for input(s): AMMONIA in the last 168 hours. Coagulation Profile: No results for input(s): INR, PROTIME in the last 168 hours. Cardiac Enzymes: No results for input(s): CKTOTAL, CKMB, CKMBINDEX, TROPONINI in the last 168 hours. BNP (last 3 results) No results for input(s): PROBNP in the last 8760 hours. HbA1C: Recent Labs    10/29/17 1008  HGBA1C 5.4   CBG: Recent Labs  Lab 10/29/17 1639 10/29/17 2237 10/30/17 0743 10/30/17 1144  GLUCAP 98 107* 106* 122*   Lipid Profile: No results for input(s): CHOL, HDL, LDLCALC, TRIG, CHOLHDL, LDLDIRECT in the last 72 hours. Thyroid Function Tests: No results for input(s): TSH, T4TOTAL, FREET4, T3FREE, THYROIDAB in the last 72 hours. Anemia Panel: No results for input(s): VITAMINB12, FOLATE, FERRITIN, TIBC, IRON, RETICCTPCT in the last 72 hours. Sepsis Labs: Recent Labs  Lab 10/29/17 1522 10/29/17 1539  PROCALCITON  --  <0.10  LATICACIDVEN 0.92  --     Recent Results (from the past 240 hour(s))  Culture, blood (routine x 2)     Status: None (Preliminary result)   Collection Time: 10/29/17  3:09 PM  Result Value Ref Range Status   Specimen Description   Final    BLOOD LEFT ANTECUBITAL Performed at [facility], [address] [place], [place] [address]    Special Requests   Final    BOTTLES DRAWN AEROBIC AND ANAEROBIC Blood Culture adequate volume Performed at [facility], [address], [place], [place] [address]    Culture   Final    NO GROWTH < 24 HOURS Performed at [facility], [address] [place], [place] [address]    Report Status PENDING  Incomplete  Respiratory Panel by PCR     Status: Abnormal   Collection Time: 10/29/17  3:39 PM  Result Value Ref Range Status   Adenovirus NOT DETECTED NOT DETECTED Final   Coronavirus 229E NOT  DETECTED NOT DETECTED Final   Coronavirus HKU1 NOT DETECTED NOT DETECTED Final   Coronavirus NL63 NOT DETECTED NOT DETECTED Final   Coronavirus OC43 NOT DETECTED NOT DETECTED Final   Metapneumovirus NOT DETECTED NOT DETECTED Final   Rhinovirus / Enterovirus NOT DETECTED NOT DETECTED Final   Influenza A EQUIVOCAL (A) NOT DETECTED Final   Influenza B NOT DETECTED NOT DETECTED Final   Parainfluenza Virus 1 NOT DETECTED NOT DETECTED Final   Parainfluenza Virus 2 NOT DETECTED NOT DETECTED Final   Parainfluenza Virus 3 NOT DETECTED NOT DETECTED Final   Parainfluenza Virus 4 NOT DETECTED NOT DETECTED Final   Respiratory Syncytial Virus NOT DETECTED NOT DETECTED Final   Bordetella pertussis NOT DETECTED NOT DETECTED Final   Chlamydophila pneumoniae NOT DETECTED NOT DETECTED Final  Mycoplasma pneumoniae NOT DETECTED NOT DETECTED Final  Culture, blood (routine x 2)     Status: None (Preliminary result)   Collection Time: 10/29/17  3:41 PM  Result Value Ref Range Status   Specimen Description   Final    BLOOD RIGHT ANTECUBITAL Performed at [facility], [address] [place], [place] [address]    Special Requests   Final    BOTTLES DRAWN AEROBIC AND ANAEROBIC Blood Culture adequate volume Performed at [facility], [address], [place], [place] [address]    Culture   Final    NO GROWTH < 24 HOURS Performed at [facility], [address], [place], [place] [address]    Report Status PENDING  Incomplete     Radiology Studies: Dg Chest 2 View  Result Date: 10/29/2017 CLINICAL DATA:  Upper respiratory tract infection EXAM: CHEST  2 VIEW COMPARISON:  None. FINDINGS: The heart size and mediastinal contours are within normal limits. Both lungs are clear. The visualized skeletal structures are unremarkable. IMPRESSION: No active cardiopulmonary disease. Electronically Signed   By: Elige Ko   On: 10/29/2017 15:39   Ct Abdomen Pelvis W  Contrast  Result Date: 10/29/2017 CLINICAL DATA:  Flu like symptoms. Elevated blood glucose. Vomiting. Chills. Crohn disease. EXAM: CT ABDOMEN AND PELVIS WITH CONTRAST TECHNIQUE: Multidetector CT imaging of the abdomen and pelvis was performed using the standard protocol following bolus administration of intravenous contrast. CONTRAST:  ISOVUE-300 IOPAMIDOL (ISOVUE-300) INJECTION 61% COMPARISON:  None. FINDINGS: Lower chest: Clear lung bases. Normal heart size without pericardial or pleural effusion. Hepatobiliary: Normal liver. Cholecystectomy, without biliary ductal dilatation. Pancreas: Normal, without mass or ductal dilatation. Spleen: Normal in size, without focal abnormality. Adrenals/Urinary Tract: Mild left adrenal thickening. Normal right adrenal gland. Normal right kidney. Interpolar left renal lesion of 1.4 cm measures fluid density, most consistent with a cyst. No hydronephrosis. Normal urinary bladder. Stomach/Bowel: Underdistention of the gastric antrum. Portions of the sigmoid are underdistended. Apparent wall thickening is felt to be secondary. Mild inflammation of the right-side of the colon, as evidenced by mucosal hyperenhancement. The colon is primarily fluid-filled, consistent with the clinical history of diarrhea. The mid to distal ileum is focally dilated, including at 4.5 cm on image 60/series 2. This continues to the level of an area of distal ileal wall thickening and submucosal fat prominence, including on image 52/series 2. The terminal most ileum (likely the neoterminal ileum) demonstrates wall thickening and subtle perienteric interstitial thickening, including on image 48/series 2. Surgical changes in the region of the ileocecal junction. The remainder of the more proximal small bowel is normal in caliber and morphology. Vascular/Lymphatic: Aortic and branch vessel atherosclerosis. No abdominopelvic adenopathy. Reproductive: Normal prostate. Other: No significant free fluid. No  free intraperitoneal air. Tiny bilateral fat containing inguinal hernias. Musculoskeletal: Degenerate disc disease at L4-5. IMPRESSION: 1. Abnormal appearance of the distal and neoterminal ileum, in this patient who has undergone prior surgical changes at the ileocecal junction. The distal ileal process is primarily favored to represent chronic/fibrostenotic Crohn disease with a component of mild upstream dilatation/obstruction. Suspect superimposed acute inflammation at the neoterminal ileum. 2. Right-sided colitis is also likely related to inflammation. Apparent sigmoid wall thickening is at least partially due to underdistention. 3. No abscess or other acute complication. 4.  Aortic Atherosclerosis (ICD10-I70.0). Electronically Signed   By: Jeronimo Greaves M.D.   On: 10/29/2017 12:04    Scheduled Meds: . enoxaparin (LOVENOX) injection  40 mg  Subcutaneous Q24H  . insulin aspart  0-9 Units Subcutaneous TID WC  . methylPREDNISolone (SOLU-MEDROL) injection  40 mg Intravenous Q12H  . oseltamivir  75 mg Oral BID   Continuous Infusions: . 0.9 % NaCl with KCl 40 mEq / L 100 mL/hr (10/30/17 0347)  . ciprofloxacin Stopped (10/30/17 0554)  . metronidazole 500 mg (10/30/17 0916)     LOS: 1 day   Rickey Barbara, MD [facility] Pager [phone]  If 7PM-7AM, please contact night-coverage [email or web address] Password [facility] 10/30/2017, 2:32 PM

## 2017-10-31 LAB — COMPREHENSIVE METABOLIC PANEL
ALBUMIN: 3.4 g/dL — AB (ref 3.5–5.0)
ALK PHOS: 42 U/L (ref 38–126)
ALT: 17 U/L (ref 17–63)
AST: 26 U/L (ref 15–41)
Anion gap: 7 (ref 5–15)
BUN: 18 mg/dL (ref 6–20)
CHLORIDE: 110 mmol/L (ref 101–111)
CO2: 22 mmol/L (ref 22–32)
Calcium: 8.1 mg/dL — ABNORMAL LOW (ref 8.9–10.3)
Creatinine, Ser: 0.72 mg/dL (ref 0.61–1.24)
GFR calc non Af Amer: >60 mL/min (ref >60)
GLUCOSE: 100 mg/dL — AB (ref 65–99)
Potassium: 3.9 mmol/L (ref 3.5–5.1)
SODIUM: 139 mmol/L (ref 135–145)
Total Bilirubin: 0.5 mg/dL (ref 0.3–1.2)
Total Protein: 6.4 g/dL — ABNORMAL LOW (ref 6.5–8.1)

## 2017-10-31 LAB — GLUCOSE, CAPILLARY
GLUCOSE-CAPILLARY: 110 mg/dL — AB (ref 65–99)
GLUCOSE-CAPILLARY: 133 mg/dL — AB (ref 65–99)
Glucose-Capillary: 100 mg/dL — ABNORMAL HIGH (ref 65–99)
Glucose-Capillary: 91 mg/dL (ref 65–99)

## 2017-10-31 LAB — MAGNESIUM: Magnesium: 2.1 mg/dL (ref 1.7–2.4)

## 2017-10-31 MED ORDER — METHYLPREDNISOLONE SODIUM SUCC 40 MG IJ SOLR
40.0000 mg | Freq: Every day | INTRAMUSCULAR | Status: DC
  Administered 2017-11-01: 40 mg via INTRAVENOUS
  Filled 2017-10-31: qty 1

## 2017-10-31 MED ORDER — CIPROFLOXACIN HCL 500 MG PO TABS
500.0000 mg | ORAL_TABLET | Freq: Two times a day (BID) | ORAL | Status: DC
  Administered 2017-10-31 – 2017-11-01 (×2): 500 mg via ORAL
  Filled 2017-10-31 (×2): qty 1

## 2017-10-31 MED ORDER — DIPHENHYDRAMINE HCL 25 MG PO CAPS
25.0000 mg | ORAL_CAPSULE | Freq: Once | ORAL | Status: AC
  Administered 2017-10-31: 25 mg via ORAL
  Filled 2017-10-31: qty 1

## 2017-10-31 MED ORDER — METRONIDAZOLE 500 MG PO TABS
500.0000 mg | ORAL_TABLET | Freq: Three times a day (TID) | ORAL | Status: DC
  Administered 2017-10-31 – 2017-11-01 (×3): 500 mg via ORAL
  Filled 2017-10-31 (×3): qty 1

## 2017-10-31 NOTE — Progress Notes (Signed)
PROGRESS NOTE    Kenneth Mcintyre  ZOX:[id number] DOB: 11-21-1950 DOA: 10/29/2017 PCP: System, Provider Not In    Brief Narrative:  67 year old male with a history of chronic disease, not on any immunologic therapy currently, [ insurance denied Humira], followed by gastroenterologist in [place], who presents today with intractable nausea, nonbilious vomiting, nonbloody diarrhea for the last 24 hours. Inability to keep any fluids down. Complaining of   diffuse abdominal pain.  In addition he's had URI-like symptoms   Assessment & Plan:   Principal Problem:   Exacerbation of Crohn's disease (HCC) Active Problems:   Partial small bowel obstruction (HCC)   Influenza A    Exacerbation of Crohn's disease (HCC)/partial small bowel obstruction in the setting of previous surgical changes at  ileo cecal  junction Right-sided colitis likely secondary to inflammation, infection not ruled out Patient had been continued on empiric antibiotics-ciprofloxacin and Flagyl IV with solumedrol Clinical improvement noted this admit Advancing diet as tolerated, currently on clears  Hypokalemia Replaced Continue to replace as needed  Hypertension BP initially soft with bp meds held on admit BP presently stable  Influenza A CXR reviewed and was clear Nasal flu swab positive for flu A Started tamiflu Currently afebrile Continue droplet precautions  DVT prophylaxis: Lovenox Code Status: Full Family Communication: Pt in room, family at bedside Disposition Plan: Uncertain at this time  Consultants:     Procedures:     Antimicrobials: Anti-infectives (From admission, onward)   Start     Dose/Rate Route Frequency Ordered Stop   10/31/17 1400  metroNIDAZOLE (FLAGYL) tablet 500 mg     500 mg Oral Every 8 hours 10/31/17 1332     10/31/17 1345  ciprofloxacin (CIPRO) tablet 500 mg     500 mg Oral 2 times daily 10/31/17 1332     10/30/17 1500  oseltamivir (TAMIFLU) capsule 75 mg     75  mg Oral 2 times daily 10/30/17 1432 11/04/17 0959   10/29/17 1600  ciprofloxacin (CIPRO) IVPB 400 mg  Status:  Discontinued     400 mg 200 mL/hr over 60 Minutes Intravenous Every 12 hours 10/29/17 1501 10/31/17 1332   10/29/17 1600  metroNIDAZOLE (FLAGYL) IVPB 500 mg  Status:  Discontinued     500 mg 100 mL/hr over 60 Minutes Intravenous Every 8 hours 10/29/17 1501 10/31/17 1332      Subjective: States feeling better today  Objective: Vitals:   10/30/17 0455 10/30/17 1535 10/30/17 2046 10/31/17 0615  BP: (!) 123/55 (!) 145/68 (!) 149/77 (!) 168/86  Pulse: 72 72 84 75  Resp: 18 16 18 18   Temp: 98.9 F (37.2 C) 99.6 F (37.6 C) 97.7 F (36.5 C) 98.1 F (36.7 C)  TempSrc: Oral Oral Oral Oral  SpO2: 98% 98% 100% 100%  Weight:      Height:        Intake/Output Summary (Last 24 hours) at 10/31/2017 1332 Last data filed at 10/31/2017 8119 Gross per 24 hour  Intake 1690 ml  Output -  Net 1690 ml   Filed Weights   10/29/17 1634  Weight: 89 kg (196 lb 3.4 oz)    Examination: General exam: Awake, laying in bed, in nad Respiratory system: Normal respiratory effort, no wheezing Cardiovascular system: regular rate, s1, s2 Gastrointestinal system: Soft, nondistended, positive BS Central nervous system: CN2-12 grossly intact, strength intact Extremities: Perfused, no clubbing Skin: Normal skin turgor, no notable skin lesions seen Psychiatry: Mood normal // no visual hallucinations   Data Reviewed:  I have personally reviewed following labs and imaging studies  CBC: Recent Labs  Lab 10/29/17 1008 10/30/17 0640  WBC 8.8 5.8  NEUTROABS 7.2  --   HGB 13.3 12.1*  HCT 37.8* 35.1*  MCV 92.0 92.6  PLT 258 219   Basic Metabolic Panel: Recent Labs  Lab 10/29/17 1008 10/30/17 0640 10/31/17 0605  NA 140 139 139  K 3.3* 3.5 3.9  CL 105 110 110  CO2 26 22 22   GLUCOSE 130* 117* 100*  BUN 15 15 18   CREATININE 0.79 0.72 0.72  CALCIUM 8.7* 7.9* 8.1*  MG 1.6*  --  2.1    GFR: Estimated Creatinine Clearance: 99.7 mL/min (by C-G formula based on SCr of 0.72 mg/dL). Liver Function Tests: Recent Labs  Lab 10/29/17 1008 10/30/17 0640 10/31/17 0605  AST 21 17 26   ALT 17 14* 17  ALKPHOS 56 41 42  BILITOT 0.7 0.5 0.5  PROT 6.8 5.8* 6.4*  ALBUMIN 3.8 3.1* 3.4*   Recent Labs  Lab 10/29/17 1008  LIPASE 30   No results for input(s): AMMONIA in the last 168 hours. Coagulation Profile: No results for input(s): INR, PROTIME in the last 168 hours. Cardiac Enzymes: No results for input(s): CKTOTAL, CKMB, CKMBINDEX, TROPONINI in the last 168 hours. BNP (last 3 results) No results for input(s): PROBNP in the last 8760 hours. HbA1C: Recent Labs    10/29/17 1008  HGBA1C 5.4   CBG: Recent Labs  Lab 10/30/17 1144 10/30/17 1757 10/30/17 2137 10/31/17 0723 10/31/17 1232  GLUCAP 122* 101* 123* 110* 133*   Lipid Profile: No results for input(s): CHOL, HDL, LDLCALC, TRIG, CHOLHDL, LDLDIRECT in the last 72 hours. Thyroid Function Tests: No results for input(s): TSH, T4TOTAL, FREET4, T3FREE, THYROIDAB in the last 72 hours. Anemia Panel: No results for input(s): VITAMINB12, FOLATE, FERRITIN, TIBC, IRON, RETICCTPCT in the last 72 hours. Sepsis Labs: Recent Labs  Lab 10/29/17 1522 10/29/17 1539  PROCALCITON  --  <0.10  LATICACIDVEN 0.92  --     Recent Results (from the past 240 hour(s))  Culture, blood (routine x 2)     Status: None (Preliminary result)   Collection Time: 10/29/17  3:09 PM  Result Value Ref Range Status   Specimen Description   Final    BLOOD LEFT ANTECUBITAL Performed at [facility], [address], [place], [place] [address]    Special Requests   Final    BOTTLES DRAWN AEROBIC AND ANAEROBIC Blood Culture adequate volume Performed at [facility], [address] [place], [place] [address]    Culture   Final    NO GROWTH < 24 HOURS Performed at [facility], [address] [place], [place] [address]    Report Status PENDING  Incomplete  Respiratory Panel by PCR     Status: Abnormal   Collection Time: 10/29/17  3:39 PM  Result Value Ref Range Status   Adenovirus NOT DETECTED NOT DETECTED Final   Coronavirus 229E NOT DETECTED NOT DETECTED Final   Coronavirus HKU1 NOT DETECTED NOT DETECTED Final   Coronavirus NL63 NOT DETECTED NOT DETECTED Final   Coronavirus OC43 NOT DETECTED NOT DETECTED Final   Metapneumovirus NOT DETECTED NOT DETECTED Final   Rhinovirus / Enterovirus NOT DETECTED NOT DETECTED Final   Influenza A EQUIVOCAL (A) NOT DETECTED Final   Influenza B NOT DETECTED NOT DETECTED Final   Parainfluenza Virus 1 NOT DETECTED NOT DETECTED Final   Parainfluenza Virus 2 NOT DETECTED NOT  DETECTED Final   Parainfluenza Virus 3 NOT DETECTED NOT DETECTED Final   Parainfluenza Virus 4 NOT DETECTED NOT DETECTED Final   Respiratory Syncytial Virus NOT DETECTED NOT DETECTED Final   Bordetella pertussis NOT DETECTED NOT DETECTED Final   Chlamydophila pneumoniae NOT DETECTED NOT DETECTED Final   Mycoplasma pneumoniae NOT DETECTED NOT DETECTED Final  Culture, blood (routine x 2)     Status: None (Preliminary result)   Collection Time: 10/29/17  3:41 PM  Result Value Ref Range Status   Specimen Description   Final    BLOOD RIGHT ANTECUBITAL Performed at [facility], [address], [place], [place] [address]    Special Requests   Final    BOTTLES DRAWN AEROBIC AND ANAEROBIC Blood Culture adequate volume Performed at [facility], [address], [place], [place] [address]    Culture   Final    NO GROWTH < 24 HOURS Performed at [facility], [address] [place], [place] [address]    Report Status PENDING  Incomplete  Gastrointestinal Panel by PCR , Stool     Status: None   Collection Time: 10/30/17  5:40 AM  Result Value Ref Range Status   Campylobacter species NOT DETECTED NOT DETECTED Final   Plesimonas  shigelloides NOT DETECTED NOT DETECTED Final   Salmonella species NOT DETECTED NOT DETECTED Final   Yersinia enterocolitica NOT DETECTED NOT DETECTED Final   Vibrio species NOT DETECTED NOT DETECTED Final   Vibrio cholerae NOT DETECTED NOT DETECTED Final   Enteroaggregative E coli (EAEC) NOT DETECTED NOT DETECTED Final   Enteropathogenic E coli (EPEC) NOT DETECTED NOT DETECTED Final   Enterotoxigenic E coli (ETEC) NOT DETECTED NOT DETECTED Final   Shiga like toxin producing E coli (STEC) NOT DETECTED NOT DETECTED Final   Shigella/Enteroinvasive E coli (EIEC) NOT DETECTED NOT DETECTED Final   Cryptosporidium NOT DETECTED NOT DETECTED Final   Cyclospora cayetanensis NOT DETECTED NOT DETECTED Final   Entamoeba histolytica NOT DETECTED NOT DETECTED Final   Giardia lamblia NOT DETECTED NOT DETECTED Final   Adenovirus F40/41 NOT DETECTED NOT DETECTED Final   Astrovirus NOT DETECTED NOT DETECTED Final   Norovirus GI/GII NOT DETECTED NOT DETECTED Final   Rotavirus A NOT DETECTED NOT DETECTED Final   Sapovirus (I, II, IV, and V) NOT DETECTED NOT DETECTED Final    Comment: Performed at [facility], [address], [place], [place] [address]     Radiology Studies: Dg Chest 2 View  Result Date: 10/29/2017 CLINICAL DATA:  Upper respiratory tract infection EXAM: CHEST  2 VIEW COMPARISON:  None. FINDINGS: The heart size and mediastinal contours are within normal limits. Both lungs are clear. The visualized skeletal structures are unremarkable. IMPRESSION: No active cardiopulmonary disease. Electronically Signed   By: Elige Ko   On: 10/29/2017 15:39    Scheduled Meds: . ciprofloxacin  500 mg Oral BID  . enoxaparin (LOVENOX) injection  40 mg Subcutaneous Q24H  . insulin aspart  0-9 Units Subcutaneous TID WC  . [START ON 11/01/2017] methylPREDNISolone (SOLU-MEDROL) injection  40 mg Intravenous Daily  . metroNIDAZOLE  500 mg Oral Q8H  . oseltamivir  75 mg Oral BID   Continuous  Infusions: . 0.9 % NaCl with KCl 40 mEq / L 50 mL/hr (10/31/17 0223)     LOS: 2 days   Rickey Barbara, MD [facility] Pager [phone]  If 7PM-7AM, please contact night-coverage [email or web address] Password [email or web address] 10/31/2017, 1:32 PM

## 2017-11-01 LAB — COMPREHENSIVE METABOLIC PANEL
ALBUMIN: 3.2 g/dL — AB (ref 3.5–5.0)
ALT: 29 U/L (ref 17–63)
ANION GAP: 5 (ref 5–15)
AST: 36 U/L (ref 15–41)
Alkaline Phosphatase: 43 U/L (ref 38–126)
BILIRUBIN TOTAL: 0.5 mg/dL (ref 0.3–1.2)
BUN: 16 mg/dL (ref 6–20)
CO2: 25 mmol/L (ref 22–32)
Calcium: 8.2 mg/dL — ABNORMAL LOW (ref 8.9–10.3)
Chloride: 110 mmol/L (ref 101–111)
Creatinine, Ser: 0.84 mg/dL (ref 0.61–1.24)
GFR calc Af Amer: >60 mL/min (ref >60)
GFR calc non Af Amer: >60 mL/min (ref >60)
GLUCOSE: 94 mg/dL (ref 65–99)
POTASSIUM: 3.8 mmol/L (ref 3.5–5.1)
SODIUM: 140 mmol/L (ref 135–145)
Total Protein: 5.9 g/dL — ABNORMAL LOW (ref 6.5–8.1)

## 2017-11-01 LAB — GLUCOSE, CAPILLARY
Glucose-Capillary: 96 mg/dL (ref 65–99)
Glucose-Capillary: 88 mg/dL (ref 65–99)

## 2017-11-01 MED ORDER — IRBESARTAN 150 MG PO TABS
150.0000 mg | ORAL_TABLET | Freq: Every day | ORAL | Status: DC
  Administered 2017-11-01: 150 mg via ORAL
  Filled 2017-11-01: qty 1

## 2017-11-01 MED ORDER — PREDNISONE 5 MG PO TABS: 5.0000 mg | ORAL_TABLET | Freq: Every day | ORAL | Status: AC

## 2017-11-01 MED ORDER — OXYCODONE-ACETAMINOPHEN 5-325 MG PO TABS
1.0000 | ORAL_TABLET | Freq: Four times a day (QID) | ORAL | Status: DC | PRN
  Administered 2017-11-01: 1 via ORAL
  Filled 2017-11-01: qty 1

## 2017-11-01 MED ORDER — AMLODIPINE BESYLATE-VALSARTAN 10-320 MG PO TABS: 0.5000 | ORAL_TABLET | Freq: Every day | ORAL | Status: DC

## 2017-11-01 MED ORDER — AMLODIPINE BESYLATE 5 MG PO TABS
5.0000 mg | ORAL_TABLET | Freq: Every day | ORAL | Status: DC
  Administered 2017-11-01: 5 mg via ORAL
  Filled 2017-11-01: qty 1

## 2017-11-01 MED ORDER — OSELTAMIVIR PHOSPHATE 75 MG PO CAPS: 75.0000 mg | ORAL_CAPSULE | Freq: Two times a day (BID) | ORAL | 0 refills | Status: AC

## 2017-11-01 MED ORDER — OXYCODONE HCL 5 MG PO TABS: 5.0000 mg | ORAL_TABLET | Freq: Four times a day (QID) | ORAL | Status: DC | PRN

## 2017-11-01 MED ORDER — OXYCODONE-ACETAMINOPHEN 10-325 MG PO TABS: 1.0000 | ORAL_TABLET | Freq: Four times a day (QID) | ORAL | Status: DC | PRN

## 2017-11-01 MED ORDER — CIPROFLOXACIN HCL 500 MG PO TABS: 500.0000 mg | ORAL_TABLET | Freq: Two times a day (BID) | ORAL | 0 refills | Status: AC

## 2017-11-01 MED ORDER — METRONIDAZOLE 500 MG PO TABS: 500.0000 mg | ORAL_TABLET | Freq: Three times a day (TID) | ORAL | 0 refills | Status: AC

## 2017-11-01 NOTE — Care Management Note (Signed)
Case Management Note  Patient Details  Name: Kenneth Mcintyre MRN: [id number] Date of Birth: 31-Oct-1950  Subjective/Objective: 67 y/o m admitted w/Crohn's exac. From home.                   Action/Plan:d/c home.   Expected Discharge Date:  (unknown)               Expected Discharge Plan:  Home/Self Care  In-House Referral:     Discharge planning Services  CM Consult  Post Acute Care Choice:    Choice offered to:     DME Arranged:    DME Agency:     HH Arranged:    HH Agency:     Status of Service:  Completed, signed off  If discussed at [employer] of Stay Meetings, dates discussed:    Additional Comments:  Lanier Clam, RN 11/01/2017, 10:35 AM

## 2017-11-01 NOTE — Discharge Summary (Signed)
Physician Discharge Summary  Kenneth Mcintyre YPP:[id number] DOB: 1951-08-08 DOA: 10/29/2017  PCP: System, Provider Not In  Admit date: 10/29/2017 Discharge date: 11/01/2017  Admitted From: Home Disposition:  Home  Recommendations for Outpatient Follow-up:  1. Follow up with PCP in 1-2 weeks 2. Follow up with GI as scheduled   Discharge Condition:Improved CODE STATUS:Full Diet recommendation: Diabetic   Brief/Interim Summary: 67 year old male with a history of chronic disease, not on any immunologic therapy currently, [ insurance denied Humira], followed by gastroenterologist in [place], who presents today with intractable nausea, nonbilious vomiting, nonbloody diarrhea for the last 24 hours. Inability to keep any fluids down. Complaining of diffuse abdominal pain. In addition he's had URI-like symptoms   Exacerbation of Crohn's disease (HCC)/partial small bowel obstruction in the setting of previous surgical changes atileocecal junction Right-sided colitis likely secondary to inflammation, infection not ruled out Patient had been continued on empiric antibiotics-ciprofloxacin and Flagyl IV with solumedrol Clinical improvement noted this admit Successfully advanced diet Transitioned to PO cipro and flagyl with prednisone taper to complete course  Hypokalemia Replaced  Hypertension BP initially soft with bp meds held on admit BP meds resumed  Influenza A CXR reviewed and was clear Nasal flu swab positive for flu A Started tamiflu Currently afebrile  Discharge Diagnoses:  Principal Problem:   Exacerbation of Crohn's disease (HCC) Active Problems:   Partial small bowel obstruction (HCC)   Influenza A   Discharge Instructions   Allergies as of 11/01/2017   No Known Allergies     Medication List    TAKE these medications   amLODipine-valsartan 10-320 MG tablet Commonly known as:  EXFORGE Take 0.5 tablets by mouth daily.   ciprofloxacin 500 MG  tablet Commonly known as:  CIPRO Take 1 tablet (500 mg total) by mouth 2 (two) times daily.   Melatonin 5 MG Tabs Take 1 tablet by mouth once.   metroNIDAZOLE 500 MG tablet Commonly known as:  FLAGYL Take 1 tablet (500 mg total) by mouth every 8 (eight) hours.   oseltamivir 75 MG capsule Commonly known as:  TAMIFLU Take 1 capsule (75 mg total) by mouth 2 (two) times daily.   oxyCODONE-acetaminophen 10-325 MG tablet Commonly known as:  PERCOCET Take 1 tablet by mouth every 6 (six) hours as needed for pain.   predniSONE 5 MG tablet Commonly known as:  DELTASONE Take 1 tablet (5 mg total) by mouth daily with breakfast.      Follow-up Information    Follow up with your PCP in 1-2 weeks. Schedule an appointment as soon as possible for a visit.          No Known Allergies   Procedures/Studies: Dg Chest 2 View  Result Date: 10/29/2017 CLINICAL DATA:  Upper respiratory tract infection EXAM: CHEST  2 VIEW COMPARISON:  None. FINDINGS: The heart size and mediastinal contours are within normal limits. Both lungs are clear. The visualized skeletal structures are unremarkable. IMPRESSION: No active cardiopulmonary disease. Electronically Signed   By: Elige Ko   On: 10/29/2017 15:39   Ct Abdomen Pelvis W Contrast  Result Date: 10/29/2017 CLINICAL DATA:  Flu like symptoms. Elevated blood glucose. Vomiting. Chills. Crohn disease. EXAM: CT ABDOMEN AND PELVIS WITH CONTRAST TECHNIQUE: Multidetector CT imaging of the abdomen and pelvis was performed using the standard protocol following bolus administration of intravenous contrast. CONTRAST:  ISOVUE-300 IOPAMIDOL (ISOVUE-300) INJECTION 61% COMPARISON:  None. FINDINGS: Lower chest: Clear lung bases. Normal heart size without pericardial or pleural effusion. Hepatobiliary: Normal  liver. Cholecystectomy, without biliary ductal dilatation. Pancreas: Normal, without mass or ductal dilatation. Spleen: Normal in size, without focal abnormality.  Adrenals/Urinary Tract: Mild left adrenal thickening. Normal right adrenal gland. Normal right kidney. Interpolar left renal lesion of 1.4 cm measures fluid density, most consistent with a cyst. No hydronephrosis. Normal urinary bladder. Stomach/Bowel: Underdistention of the gastric antrum. Portions of the sigmoid are underdistended. Apparent wall thickening is felt to be secondary. Mild inflammation of the right-side of the colon, as evidenced by mucosal hyperenhancement. The colon is primarily fluid-filled, consistent with the clinical history of diarrhea. The mid to distal ileum is focally dilated, including at 4.5 cm on image 60/series 2. This continues to the level of an area of distal ileal wall thickening and submucosal fat prominence, including on image 52/series 2. The terminal most ileum (likely the neoterminal ileum) demonstrates wall thickening and subtle perienteric interstitial thickening, including on image 48/series 2. Surgical changes in the region of the ileocecal junction. The remainder of the more proximal small bowel is normal in caliber and morphology. Vascular/Lymphatic: Aortic and branch vessel atherosclerosis. No abdominopelvic adenopathy. Reproductive: Normal prostate. Other: No significant free fluid. No free intraperitoneal air. Tiny bilateral fat containing inguinal hernias. Musculoskeletal: Degenerate disc disease at L4-5. IMPRESSION: 1. Abnormal appearance of the distal and neoterminal ileum, in this patient who has undergone prior surgical changes at the ileocecal junction. The distal ileal process is primarily favored to represent chronic/fibrostenotic Crohn disease with a component of mild upstream dilatation/obstruction. Suspect superimposed acute inflammation at the neoterminal ileum. 2. Right-sided colitis is also likely related to inflammation. Apparent sigmoid wall thickening is at least partially due to underdistention. 3. No abscess or other acute complication. 4.  Aortic  Atherosclerosis (ICD10-I70.0). Electronically Signed   By: Jeronimo Greaves M.D.   On: 10/29/2017 12:04     Subjective: Feels better. Eager to go home  Discharge Exam: Vitals:   10/31/17 2203 11/01/17 0601  BP: (!) 143/74 (!) 166/79  Pulse: (!) 56 (!) 58  Resp: 18 18  Temp: 98.5 F (36.9 C) 97.8 F (36.6 C)  SpO2: 96% 98%   Vitals:   10/31/17 0615 10/31/17 1526 10/31/17 2203 11/01/17 0601  BP: (!) 168/86 (!) 164/89 (!) 143/74 (!) 166/79  Pulse: 75 (!) 59 (!) 56 (!) 58  Resp: 18 18 18 18   Temp: 98.1 F (36.7 C)  98.5 F (36.9 C) 97.8 F (36.6 C)  TempSrc: Oral Oral Oral Oral  SpO2: 100% 99% 96% 98%  Weight:      Height:        General: Pt is alert, awake, not in acute distress Cardiovascular: RRR, S1/S2 +, no rubs, no gallops Respiratory: CTA bilaterally, no wheezing, no rhonchi Abdominal: Soft, NT, ND, bowel sounds + Extremities: no edema, no cyanosis   The results of significant diagnostics from this hospitalization (including imaging, microbiology, ancillary and laboratory) are listed below for reference.     Microbiology: Recent Results (from the past 240 hour(s))  Culture, blood (routine x 2)     Status: None (Preliminary result)   Collection Time: 10/29/17  3:09 PM  Result Value Ref Range Status   Specimen Description   Final    BLOOD LEFT ANTECUBITAL Performed at [facility], [address], [place], [place] [address]    Special Requests   Final    BOTTLES DRAWN AEROBIC AND ANAEROBIC Blood Culture adequate volume Performed at [facility], [address], [place], [place] [address]  Culture   Final    NO GROWTH 2 DAYS Performed at [facility], [address], [place], [place] [address]    Report Status PENDING  Incomplete  Respiratory Panel by PCR     Status: Abnormal   Collection Time: 10/29/17  3:39 PM  Result Value Ref Range Status   Adenovirus NOT DETECTED NOT DETECTED Final   Coronavirus 229E NOT  DETECTED NOT DETECTED Final   Coronavirus HKU1 NOT DETECTED NOT DETECTED Final   Coronavirus NL63 NOT DETECTED NOT DETECTED Final   Coronavirus OC43 NOT DETECTED NOT DETECTED Final   Metapneumovirus NOT DETECTED NOT DETECTED Final   Rhinovirus / Enterovirus NOT DETECTED NOT DETECTED Final   Influenza A EQUIVOCAL (A) NOT DETECTED Final   Influenza B NOT DETECTED NOT DETECTED Final   Parainfluenza Virus 1 NOT DETECTED NOT DETECTED Final   Parainfluenza Virus 2 NOT DETECTED NOT DETECTED Final   Parainfluenza Virus 3 NOT DETECTED NOT DETECTED Final   Parainfluenza Virus 4 NOT DETECTED NOT DETECTED Final   Respiratory Syncytial Virus NOT DETECTED NOT DETECTED Final   Bordetella pertussis NOT DETECTED NOT DETECTED Final   Chlamydophila pneumoniae NOT DETECTED NOT DETECTED Final   Mycoplasma pneumoniae NOT DETECTED NOT DETECTED Final  Culture, blood (routine x 2)     Status: None (Preliminary result)   Collection Time: 10/29/17  3:41 PM  Result Value Ref Range Status   Specimen Description   Final    BLOOD RIGHT ANTECUBITAL Performed at [facility], [address], [place], [place] [address]    Special Requests   Final    BOTTLES DRAWN AEROBIC AND ANAEROBIC Blood Culture adequate volume Performed at [facility], [address] [place], [place] [address]    Culture   Final    NO GROWTH 2 DAYS Performed at [facility], [address] [place], [place] [address]    Report Status PENDING  Incomplete  Gastrointestinal Panel by PCR , Stool     Status: None   Collection Time: 10/30/17  5:40 AM  Result Value Ref Range Status   Campylobacter species NOT DETECTED NOT DETECTED Final   Plesimonas shigelloides NOT DETECTED NOT DETECTED Final   Salmonella species NOT DETECTED NOT DETECTED Final   Yersinia enterocolitica NOT DETECTED NOT DETECTED Final   Vibrio species NOT DETECTED NOT DETECTED Final   Vibrio cholerae NOT DETECTED NOT DETECTED Final    Enteroaggregative E coli (EAEC) NOT DETECTED NOT DETECTED Final   Enteropathogenic E coli (EPEC) NOT DETECTED NOT DETECTED Final   Enterotoxigenic E coli (ETEC) NOT DETECTED NOT DETECTED Final   Shiga like toxin producing E coli (STEC) NOT DETECTED NOT DETECTED Final   Shigella/Enteroinvasive E coli (EIEC) NOT DETECTED NOT DETECTED Final   Cryptosporidium NOT DETECTED NOT DETECTED Final   Cyclospora cayetanensis NOT DETECTED NOT DETECTED Final   Entamoeba histolytica NOT DETECTED NOT DETECTED Final   Giardia lamblia NOT DETECTED NOT DETECTED Final   Adenovirus F40/41 NOT DETECTED NOT DETECTED Final   Astrovirus NOT DETECTED NOT DETECTED Final   Norovirus GI/GII NOT DETECTED NOT DETECTED Final   Rotavirus A NOT DETECTED NOT DETECTED Final   Sapovirus (I, II, IV, and V) NOT DETECTED NOT DETECTED Final    Comment: Performed at [facility], [address] [place], [place] [address]     Labs: BNP (last 3 results) No results for input(s): BNP in the last 8760 hours. Basic Metabolic Panel: Recent Labs  Lab 10/29/17 1008 10/30/17 0640 10/31/17 0605 11/01/17 0558  NA 140 139 139 140  K 3.3* 3.5 3.9 3.8  CL 105 110 110 110  CO2 26 22 22 25   GLUCOSE 130* 117* 100* 94  BUN 15 15 18 16   CREATININE 0.79 0.72 0.72 0.84  CALCIUM 8.7* 7.9* 8.1* 8.2*  MG 1.6*  --  2.1  --    Liver Function Tests: Recent Labs  Lab 10/29/17 1008 10/30/17 0640 10/31/17 0605 11/01/17 0558  AST 21 17 26  36  ALT 17 14* 17 29  ALKPHOS 56 41 42 43  BILITOT 0.7 0.5 0.5 0.5  PROT 6.8 5.8* 6.4* 5.9*  ALBUMIN 3.8 3.1* 3.4* 3.2*   Recent Labs  Lab 10/29/17 1008  LIPASE 30   No results for input(s): AMMONIA in the last 168 hours. CBC: Recent Labs  Lab 10/29/17 1008 10/30/17 0640  WBC 8.8 5.8  NEUTROABS 7.2  --   HGB 13.3 12.1*  HCT 37.8* 35.1*  MCV 92.0 92.6  PLT 258 219   Cardiac Enzymes: No results for input(s): CKTOTAL, CKMB, CKMBINDEX, TROPONINI in the last 168  hours. BNP: Invalid input(s): POCBNP CBG: Recent Labs  Lab 10/31/17 0723 10/31/17 1232 10/31/17 1709 10/31/17 2205 11/01/17 0748  GLUCAP 110* 133* 100* 91 96   D-Dimer No results for input(s): DDIMER in the last 72 hours. Hgb A1c No results for input(s): HGBA1C in the last 72 hours. Lipid Profile No results for input(s): CHOL, HDL, LDLCALC, TRIG, CHOLHDL, LDLDIRECT in the last 72 hours. Thyroid function studies No results for input(s): TSH, T4TOTAL, T3FREE, THYROIDAB in the last 72 hours.  Invalid input(s): FREET3 Anemia work up No results for input(s): VITAMINB12, FOLATE, FERRITIN, TIBC, IRON, RETICCTPCT in the last 72 hours. Urinalysis No results found for: COLORURINE, APPEARANCEUR, LABSPEC, PHURINE, GLUCOSEU, HGBUR, BILIRUBINUR, KETONESUR, PROTEINUR, UROBILINOGEN, NITRITE, LEUKOCYTESUR Sepsis Labs Invalid input(s): PROCALCITONIN,  WBC,  LACTICIDVEN Microbiology Recent Results (from the past 240 hour(s))  Culture, blood (routine x 2)     Status: None (Preliminary result)   Collection Time: 10/29/17  3:09 PM  Result Value Ref Range Status   Specimen Description   Final    BLOOD LEFT ANTECUBITAL Performed at [facility], [address], [place], [place] [address]    Special Requests   Final    BOTTLES DRAWN AEROBIC AND ANAEROBIC Blood Culture adequate volume Performed at [facility], [address] [place], [place] [address]    Culture   Final    NO GROWTH 2 DAYS Performed at [facility], [address] [place], [place] [address]    Report Status PENDING  Incomplete  Respiratory Panel by PCR     Status: Abnormal   Collection Time: 10/29/17  3:39 PM  Result Value Ref Range Status   Adenovirus NOT DETECTED NOT DETECTED Final   Coronavirus 229E NOT DETECTED NOT DETECTED Final   Coronavirus HKU1 NOT DETECTED NOT DETECTED Final   Coronavirus NL63 NOT DETECTED NOT DETECTED Final   Coronavirus OC43 NOT DETECTED NOT  DETECTED Final   Metapneumovirus NOT DETECTED NOT DETECTED Final   Rhinovirus / Enterovirus NOT DETECTED NOT DETECTED Final   Influenza A EQUIVOCAL (A) NOT DETECTED Final   Influenza B NOT DETECTED NOT DETECTED Final   Parainfluenza Virus 1 NOT DETECTED NOT DETECTED Final   Parainfluenza Virus 2 NOT DETECTED NOT DETECTED Final   Parainfluenza Virus 3 NOT DETECTED NOT DETECTED Final   Parainfluenza Virus 4 NOT  DETECTED NOT DETECTED Final   Respiratory Syncytial Virus NOT DETECTED NOT DETECTED Final   Bordetella pertussis NOT DETECTED NOT DETECTED Final   Chlamydophila pneumoniae NOT DETECTED NOT DETECTED Final   Mycoplasma pneumoniae NOT DETECTED NOT DETECTED Final  Culture, blood (routine x 2)     Status: None (Preliminary result)   Collection Time: 10/29/17  3:41 PM  Result Value Ref Range Status   Specimen Description   Final    BLOOD RIGHT ANTECUBITAL Performed at [facility], [address], [place], [place] [address]    Special Requests   Final    BOTTLES DRAWN AEROBIC AND ANAEROBIC Blood Culture adequate volume Performed at [facility], [address] [place], [place] [address]    Culture   Final    NO GROWTH 2 DAYS Performed at [facility], [address] [place], [place] [address]    Report Status PENDING  Incomplete  Gastrointestinal Panel by PCR , Stool     Status: None   Collection Time: 10/30/17  5:40 AM  Result Value Ref Range Status   Campylobacter species NOT DETECTED NOT DETECTED Final   Plesimonas shigelloides NOT DETECTED NOT DETECTED Final   Salmonella species NOT DETECTED NOT DETECTED Final   Yersinia enterocolitica NOT DETECTED NOT DETECTED Final   Vibrio species NOT DETECTED NOT DETECTED Final   Vibrio cholerae NOT DETECTED NOT DETECTED Final   Enteroaggregative E coli (EAEC) NOT DETECTED NOT DETECTED Final   Enteropathogenic E coli (EPEC) NOT DETECTED NOT DETECTED Final   Enterotoxigenic E coli (ETEC) NOT  DETECTED NOT DETECTED Final   Shiga like toxin producing E coli (STEC) NOT DETECTED NOT DETECTED Final   Shigella/Enteroinvasive E coli (EIEC) NOT DETECTED NOT DETECTED Final   Cryptosporidium NOT DETECTED NOT DETECTED Final   Cyclospora cayetanensis NOT DETECTED NOT DETECTED Final   Entamoeba histolytica NOT DETECTED NOT DETECTED Final   Giardia lamblia NOT DETECTED NOT DETECTED Final   Adenovirus F40/41 NOT DETECTED NOT DETECTED Final   Astrovirus NOT DETECTED NOT DETECTED Final   Norovirus GI/GII NOT DETECTED NOT DETECTED Final   Rotavirus A NOT DETECTED NOT DETECTED Final   Sapovirus (I, II, IV, and V) NOT DETECTED NOT DETECTED Final    Comment: Performed at [facility], [address], [place], [place] [address]     SIGNED:   Rickey Barbara, MD  [facility] 11/01/2017, 10:41 AM  If 7PM-7AM, please contact night-coverage [email or web address] Password [email or web address]

## 2017-11-03 LAB — CULTURE, BLOOD (ROUTINE X 2)
CULTURE: NO GROWTH
CULTURE: NO GROWTH
SPECIAL REQUESTS: ADEQUATE
Special Requests: ADEQUATE

## 2018-06-18 LAB — BASIC METABOLIC PANEL
Anion Gap: 12 mmol/L (ref 2–17)
BUN: 15 mg/dL (ref 8–23)
CO2: 27 mmol/L (ref 22–29)
Calcium: 9.5 mg/dL (ref 8.8–10.2)
Chloride: 101 mmol/L (ref 98–107)
Creatinine: 0.6 mg/dL — ABNORMAL LOW (ref 0.7–1.3)
GFR African American: 121 mL/min/{1.73_m2} (ref >=90)
GFR Non-African American: 104 mL/min/{1.73_m2} (ref >=90)
Glucose: 141 mg/dL — ABNORMAL HIGH (ref 70–99)
Potassium: 4.1 mmol/L (ref 3.5–5.3)
Sodium: 140 mmol/L (ref 135–145)

## 2018-06-18 LAB — CBC
Hematocrit: 44.8 % (ref 38.0–52.0)
Hemoglobin: 15.3 g/dL (ref 13.0–17.3)
MCH: 32.1 pg (ref 27.0–34.5)
MCHC: 34.1 g/dL (ref 32.0–36.0)
MCV: 94.1 fL (ref 84.0–100.0)
MPV: 8.1 fL (ref 7.2–11.2)
Platelets: 355 10*3/uL (ref 140–440)
RBC: 4.76 x10e6/mcL (ref 4.00–5.60)
RDW: 13.4 % (ref 11.0–16.0)
WBC: 14.5 10*3/uL — ABNORMAL HIGH (ref 3.8–10.6)

## 2018-06-18 LAB — HEPATIC FUNCTION PANEL
ALT: 14 U/L (ref 0–41)
AST: 20 U/L (ref 0–40)
Albumin: 4.3 g/dL (ref 3.5–5.2)
Alk Phosphatase: 81 U/L (ref 40–130)
Bilirubin, Direct: <0.2 mg/dL (ref 0.00–0.30)
Total Bilirubin: 0.4 mg/dL (ref 0.00–1.20)
Total Protein: 7.7 g/dL (ref 6.4–8.3)

## 2018-06-18 LAB — POC URINALYSIS, CHEMISTRY
Bilirubin, Urine, POC: NEGATIVE
Blood, UA POC: NEGATIVE
Glucose, UA POC: NEGATIVE mg/dL
Ketones, Urine, POC: NEGATIVE mg/dL
Leukocytes, UA: NEGATIVE
Nitrate, UA POC: NEGATIVE
Protein, Urine, POC: NEGATIVE
Specific Gravity, Urine, POC: 1.02 (ref 1.003–1.035)
UROBILIN U POC: 0.2 EU/dL
pH, Urine, POC: 6 (ref 4.5–8.0)

## 2018-06-18 LAB — POC LACTIC ACID: POC Lactic Acid: 0.9 mmol/L (ref 0.4–2.0)

## 2018-06-18 LAB — LIPASE: Lipase: 40 U/L (ref 13–60)

## 2018-06-18 NOTE — Nursing Note (Signed)
Medication Administration Follow Up-Text       Medication Administration Follow Up Entered On:  06/18/2018 12:58 EDT    Performed On:  06/18/2018 12:58 EDT by Cherylann BanasMeloy, RN, Ruben GottronElizabeth H      Intervention Information:     ondansetron  Performed by Cherylann BanasMeloy, RN, Ruben GottronElizabeth H on 06/18/2018 12:32:00 EDT       ondansetron,4mg   IV Push,Antecubital, Right       Med Response   ED Medication Response :   No adverse reaction   Numeric Rating Pain Scale :   0 = No pain   Pasero Opioid Induced Sedation Scale :   S = Sleep, easy to arouse   Molli BarrowsMeloy, RN, Elizabeth H - 06/18/2018 12:58 EDT

## 2018-06-18 NOTE — Nursing Note (Signed)
Medication Administration Follow Up-Text       Medication Administration Follow Up Entered On:  06/18/2018 15:00 EDT    Performed On:  06/18/2018 15:00 EDT by Cherylann Banas, RN, Ruben Gottron      Intervention Information:     hydromorphone  Performed by Cherylann Banas, RN, Ruben Gottron on 06/18/2018 14:41:00 EDT       hydromorphone,0.5mg   IV Push,Antecubital, Right       Med Response   ED Medication Response :   No adverse reaction   Numeric Rating Pain Scale :   5 = Moderate pain   Pasero Opioid Induced Sedation Scale :   1 = Awake and alert   Molli Barrows - 06/18/2018 15:00 EDT

## 2018-06-18 NOTE — Nursing Note (Signed)
Medication Administration Follow Up-Text       Medication Administration Follow Up Entered On:  06/18/2018 12:58 EDT    Performed On:  06/18/2018 12:58 EDT by Cherylann BanasMeloy, RN, Ruben GottronElizabeth H      Intervention Information:     hydromorphone  Performed by Cherylann BanasMeloy, RN, Ruben GottronElizabeth H on 06/18/2018 12:32:00 EDT       hydromorphone,1mg   IV Push,Antecubital, Right       Med Response   ED Medication Response :   No adverse reaction   Numeric Rating Pain Scale :   0 = No pain   Pasero Opioid Induced Sedation Scale :   S = Sleep, easy to arouse   Molli BarrowsMeloy, RN, Elizabeth H - 06/18/2018 12:58 EDT

## 2018-06-28 LAB — BASIC METABOLIC PANEL
Anion Gap: 8 mmol/L (ref 2–17)
BUN: 15 mg/dL (ref 8–23)
CO2: 32 mmol/L — ABNORMAL HIGH (ref 22–29)
Calcium: 9.1 mg/dL (ref 8.8–10.2)
Chloride: 104 mmol/L (ref 98–107)
Creatinine: 0.7 mg/dL (ref 0.7–1.3)
GFR African American: 113 mL/min/{1.73_m2} (ref >=90)
GFR Non-African American: 98 mL/min/{1.73_m2} (ref >=90)
Glucose: 89 mg/dL (ref 70–99)
OSMOLALITY CALCULATED: 287 mOsm/kg (ref 270–287)
Potassium: 4.1 mmol/L (ref 3.5–5.3)
Sodium: 144 mmol/L (ref 135–145)

## 2018-06-28 LAB — HBSAG: Hepatitis B Surface Ag: NONREACTIVE

## 2018-06-28 LAB — HEPATITIS B CORE ANTIBODY, TOTAL: Hep B Core Total Ab: NONREACTIVE

## 2018-06-29 LAB — CBC
Hematocrit: 40.1 % (ref 38.0–52.0)
Hemoglobin: 13.6 g/dL (ref 13.0–17.3)
MCH: 32.2 pg (ref 27.0–34.5)
MCHC: 33.9 g/dL (ref 32.0–36.0)
MCV: 94.9 fL (ref 84.0–100.0)
MPV: 8.6 fL (ref 7.2–13.2)
Platelets: 357 10*3/uL (ref 140–440)
RBC: 4.23 x10e6/mcL (ref 4.00–5.60)
RDW: 13.8 % (ref 11.0–16.0)
WBC: 10.7 10*3/uL — ABNORMAL HIGH (ref 3.8–10.6)

## 2018-06-30 LAB — QUANTIFERON (R) TB GOLD: Quantiferon TB Gold Plus: NEGATIVE

## 2018-08-01 LAB — LIPID PANEL
Chol/HDL Ratio: 2.3 (ref 0.0–4.4)
Cholesterol: 166 mg/dL (ref 100–200)
HDL: 71 mg/dL (ref 55–72)
LDL Cholesterol: 44 mg/dL (ref 0.0–100.0)
LDL/HDL Ratio: 0.6
Triglycerides: 253 mg/dL — ABNORMAL HIGH (ref 0–149)
VLDL: 50.6 mg/dL — ABNORMAL HIGH (ref 5.0–40.0)

## 2018-08-01 LAB — CBC WITH AUTO DIFFERENTIAL
Absolute Eos #: 0.2 10*3/uL (ref 0.0–0.5)
Absolute Lymph #: 3 10*3/uL (ref 1.0–3.2)
Absolute Mono #: 0.7 10*3/uL (ref 0.3–1.0)
Basophils %: 0.7 % (ref 0.0–2.0)
Eosinophils %: 1.7 % (ref 0.0–7.0)
Hematocrit: 41.8 % (ref 38.0–52.0)
Hemoglobin: 14.5 g/dL (ref 13.0–17.3)
Lymphocytes: 32.5 % (ref 15.0–45.0)
MCH: 32.7 pg (ref 27.0–34.5)
MCHC: 34.7 g/dL (ref 32.0–36.0)
MCV: 94 fL (ref 84.0–100.0)
MPV: 9.1 fL (ref 7.2–13.2)
Monocytes: 7.2 % (ref 4.0–12.0)
Neutrophils %: 57.9 % (ref 42.0–74.0)
Neutrophils Absolute: 5.3 10*3/uL (ref 1.6–7.3)
Platelets: 318 10*3/uL (ref 140–440)
RBC: 4.45 x10e6/mcL (ref 4.00–5.60)
RDW: 13.7 % (ref 11.0–16.0)
WBC: 9.2 10*3/uL (ref 3.8–10.6)

## 2018-08-01 LAB — COMPREHENSIVE METABOLIC PANEL
ALT: 26 U/L (ref 0–41)
AST: 29 U/L (ref 0–40)
Albumin/Globulin Ratio: 1.6 mmol/L (ref 1.00–2.00)
Albumin: 4.5 g/dL (ref 3.5–5.2)
Alk Phosphatase: 73 U/L (ref 40–130)
Anion Gap: 16 mmol/L (ref 2–17)
BUN: 13 mg/dL (ref 8–23)
CO2: 23 mmol/L (ref 22–29)
Calcium: 9.7 mg/dL (ref 8.8–10.2)
Chloride: 103 mmol/L (ref 98–107)
Creatinine: 0.7 mg/dL (ref 0.7–1.3)
GFR African American: 113 mL/min/{1.73_m2} (ref >=90)
GFR Non-African American: 98 mL/min/{1.73_m2} (ref >=90)
Globulin: 3 g/dL (ref 1.9–4.4)
Glucose: 98 mg/dL (ref 70–99)
OSMOLALITY CALCULATED: 283 mOsm/kg (ref 270–287)
Sodium: 142 mmol/L (ref 135–145)
Total Bilirubin: 0.3 mg/dL (ref 0.00–1.20)
Total Protein: 7.3 g/dL (ref 6.4–8.3)

## 2018-08-01 LAB — PSA SCREENING: Screening PSA: 0.55 ng/mL (ref 0.000–4.000)

## 2018-08-01 LAB — TSH: TSH, 3RD GENERATION: 0.959 mcIU/mL (ref 0.358–3.740)

## 2018-09-14 LAB — BASIC METABOLIC PANEL
Anion Gap: 14 mmol/L (ref 2–17)
BUN: 15 mg/dL (ref 8–23)
CO2: 29 mmol/L (ref 22–29)
Calcium: 9.6 mg/dL (ref 8.8–10.2)
Chloride: 100 mmol/L (ref 98–107)
Creatinine: 0.7 mg/dL (ref 0.7–1.3)
GFR African American: 113 mL/min/{1.73_m2} (ref >=90)
GFR Non-African American: 98 mL/min/{1.73_m2} (ref >=90)
Glucose: 97 mg/dL (ref 70–99)
OSMOLALITY CALCULATED: 286 mOsm/kg (ref 270–287)
Potassium: 4.6 mmol/L (ref 3.5–5.3)
Sodium: 143 mmol/L (ref 135–145)

## 2018-09-14 LAB — CBC WITH AUTO DIFFERENTIAL
Absolute Baso #: 0.1 10*3/uL (ref 0.0–0.2)
Absolute Eos #: 0.3 10*3/uL (ref 0.0–0.5)
Absolute Lymph #: 3.9 10*3/uL — ABNORMAL HIGH (ref 1.0–3.2)
Absolute Mono #: 1.1 10*3/uL — ABNORMAL HIGH (ref 0.3–1.0)
Basophils %: 1 % (ref 0.0–2.0)
Eosinophils %: 2.2 % (ref 0.0–7.0)
Hematocrit: 45.4 % (ref 38.0–52.0)
Hemoglobin: 14.6 g/dL (ref 12.0–17.3)
Immature Grans (Abs): 0.1 10*3/uL
Immature Granulocytes: 0.4 %
Lymphocytes: 32.8 % (ref 15.0–45.0)
MCH: 30.5 pg (ref 27.0–34.5)
MCHC: 32.2 g/dL (ref 32.0–36.0)
MCV: 95 fL (ref 84.0–100.0)
MPV: 10.3 fL (ref 7.2–13.2)
Monocytes: 9.2 % (ref 4.0–12.0)
NRBC Absolute: 0 10*3/uL
NRBC Automated: 0 %
Neutrophils %: 54.4 % (ref 42.0–74.0)
Neutrophils Absolute: 6.4 10*3/uL (ref 1.6–7.3)
Platelets: 371 10*3/uL (ref 140–440)
RBC: 4.78 x10e6/mcL (ref 4.00–5.20)
RDW: 12.5 % (ref 11.0–16.0)
WBC: 11.8 10*3/uL — ABNORMAL HIGH (ref 3.8–10.6)

## 2018-11-01 LAB — BASIC METABOLIC PANEL
Anion Gap: 18 mmol/L — ABNORMAL HIGH (ref 2–17)
BUN: 15 mg/dL (ref 8–23)
CO2: 26 mmol/L (ref 22–29)
Calcium: 10.6 mg/dL — ABNORMAL HIGH (ref 8.8–10.2)
Chloride: 95 mmol/L — ABNORMAL LOW (ref 98–107)
Creatinine: 0.8 mg/dL (ref 0.7–1.3)
GFR African American: 107 mL/min/{1.73_m2} (ref >=90)
GFR Non-African American: 92 mL/min/{1.73_m2} (ref >=90)
Glucose: 151 mg/dL — ABNORMAL HIGH (ref 70–99)
OSMOLALITY CALCULATED: 281 mOsm/kg (ref 270–287)
Potassium: 3.9 mmol/L (ref 3.5–5.3)
Sodium: 139 mmol/L (ref 135–145)

## 2018-11-01 LAB — LIPASE: Lipase: 112 U/L — ABNORMAL HIGH (ref 13–60)

## 2018-11-01 LAB — CBC
Hematocrit: 49.6 % (ref 38.0–52.0)
Hemoglobin: 16.7 g/dL (ref 12.0–17.3)
MCH: 31.5 pg (ref 27.0–34.5)
MCHC: 33.7 g/dL (ref 32.0–36.0)
MCV: 93.4 fL (ref 84.0–100.0)
MPV: 10.5 fL (ref 7.2–13.2)
Platelets: 339 10*3/uL (ref 140–440)
RBC: 5.31 x10e6/mcL — ABNORMAL HIGH (ref 4.00–5.20)
RDW: 12.5 % (ref 11.0–16.0)
WBC: 16.7 10*3/uL — ABNORMAL HIGH (ref 3.8–10.6)

## 2018-11-01 LAB — POC URINALYSIS, CHEMISTRY
Bilirubin, Urine, POC: NEGATIVE
Glucose, UA POC: NEGATIVE mg/dL
Ketones, Urine, POC: NEGATIVE mg/dL
Leukocytes, UA: NEGATIVE
Nitrate, UA POC: NEGATIVE
Protein, Urine, POC: NEGATIVE
Specific Gravity, Urine, POC: 1.02 (ref 1.003–1.035)
UROBILIN U POC: 0.2 EU/dL
pH, Urine, POC: 6 (ref 4.5–8.0)

## 2018-11-01 LAB — HEPATIC FUNCTION PANEL
ALT: 18 U/L (ref 0–41)
AST: 24 U/L (ref 0–40)
Albumin: 4.9 g/dL (ref 3.5–5.2)
Alk Phosphatase: 84 U/L (ref 40–130)
Bilirubin, Direct: <0.2 mg/dL (ref 0.00–0.30)
Total Bilirubin: 0.61 mg/dL (ref 0.00–1.20)
Total Protein: 8.8 g/dL — ABNORMAL HIGH (ref 6.4–8.3)

## 2018-11-01 NOTE — Discharge Summary (Signed)
 ED Clinical Summary                         [facility]  [address]  [place], [place] [address]  [phone]           PERSON INFORMATION  Name: ARMONTE, TORTORELLA Age:  68 Years DOB: 06-12-1951   Sex: Male Language: English PCP: NONIE FALLOW III   Marital Status:  Married Phone: [phone] Med Service: MED-Medicine   MRN:  [id number] Acct# [id number] Arrival: 11/01/2018 11:18:00   Visit Reason: Abdominal pain; N/ ACTIVE V/ABD PAIN/BACK PAIN Acuity: 3 LOS: 000 03:20   Address:      [address] [place] [address]  Diagnosis:      Small bowel obstruction  Printed Prescriptions:            Allergies      No Known Allergies      Medications Administered During Visit:                  Medication Dose Route   Sodium Chloride 0.9% 1000 mL IV Piggyback   ondansetron 4 mg IV Push   iopamidol 100 mL IV Contrast   ketorolac 30 mg IV Push   morphine 4 mg IV Push       Patient Medication List:              ondansetron (Zofran 4 mg oral tablet) 1 Tabs Oral (given by mouth) every 4 hours as needed nausea for 3 Days. Refills: 0.  promethazine (Phenergan 12.5 mg rectal suppository) 1 Suppositories Per rectum (in the rectum) every 6 hours as needed nausea for 3 Days. Refills: 0.         Major Tests and Procedures:  The following procedures and tests were performed during your ED visit.  COMMONPROCEDURES%>  COMMON PROCEDURESCOMMENTS%>          Laboratory Orders  Name Status Details   .UA POC Completed Urine, RT, RT - Routine, Collected, 11/01/18 11:24:00 EST, Nurse collect, 11/01/18 11:24:00 US Robinette, RAL POC Login   BMP Completed Blood, Stat, ST - Stat, 11/01/18 9:12:00 EST, 11/01/18 9:12:00 EST, Nurse collect, MCNEILL-PA-C,  KATHERINE E, Print label Y/N   CBC Completed Blood, Stat, ST - Stat, 11/01/18 9:12:00 EST, 11/01/18 9:12:00 EST, Nurse collect, MCNEILL-PA-C,  KATHERINE E, Print label Y/N   Hepatic Completed Blood, Stat, ST - Stat, 11/01/18 9:12:00 EST, 11/01/18 9:12:00  EST, Nurse collect, MCNEILL-PA-C,  KATHERINE E, Print label Y/N   Lipase Lvl Completed Blood, Stat, ST - Stat, 11/01/18 9:12:00 EST, 11/01/18 9:12:00 EST, Nurse collect, MCNEILL-PA-C,  KATHERINE E, Print label Y/N   XTube Blue Completed Blood, Stat, ST - Stat, Collected, 11/01/18 9:13:00 EST [id number], 11/01/18 9:13:00 EST, Nurse collect, Venous Draw, 11/01/18 9:29:00 EST, BH CP Login, MCNEILL-PA-C,  KATHERINE E, Print label Y/N, bh1_spec_lbl2, bh1_spec_lbl3, 1.8 mL Aolz/*[id number]*/,...               Radiology Orders  Name Status Details   CT Abdomen Pelvis w/ Contrast Completed 11/01/18 9:12:00 EST, STAT 1 hour or less, Reason: Abdominal pain, unspecified, Transport Mode: STRETCHER, pp_set_radiology_subspecialty   XR Abdomen AP, Post Procedure Completed 11/01/18 10:37:00 EST, STAT 1 hour or less, Reason: Encounter for fitting and adjustment of other gastrointestinal appliance and device, Transport Mode: STRETCHER, pp_set_radiology_subspecialty               Patient Care Orders  Name  Status Details   Bed Request Communication Ordered 11/01/18 11:18:00 EST floor, small bowel obstruction   Change attending to Ordered 11/01/18 11:18:00 EST, BRIANT HICKS A   ED Assessment Adult Completed 11/01/18 8:35:53 EST, 11/01/18 8:35:53 EST   ED Secondary Triage Completed 11/01/18 8:35:53 EST, 11/01/18 8:35:53 EST   ED Triage Adult Completed 11/01/18 8:27:51 EST, 11/01/18 8:27:51 EST   Nasogastric/Orogastric Tube Insertion Completed 11/01/18 10:15:00 EST, 11/01/18 10:15:00 EST   POC-Urine Dipstick collect Completed 11/01/18 9:12:00 EST, Once, 11/01/18 9:12:00 EST   POC-Urine Dipstick collect Completed 11/01/18 10:08:00 EST, Once, 11/01/18 10:08:00 EST   Patient Specific Fall Safety Measures Completed 11/01/18 8:38:09 EST, Once, 11/01/18 8:38:09 EST, ED Low Fall Risk Documentation   Saline Lock Insert Completed 11/01/18 9:12:00 EST, Once, 11/01/18 9:12:00 EST   VTE Quality Measures Ordered 11/01/18 11:19:29 EST, 11/01/18  11:19:29 EST             PROVIDER INFORMATION               Provider Role Assigned Sampson Agent, RN, Christina ED Nurse 11/01/2018 08:31:04    MCNEILL-PA-C, COMER BRAVO ED MidLevel 11/01/2018 09:03:01        Attending Physician:  BRIANT HICKS A     Admit Doc  GUICHARD-MD,  JARED A     Consulting Doc  GUICHARD-MD,  JARED A     VITALS INFORMATION  Vital Sign Triage Latest   Temp Oral ORAL_1%>36.9 degC ORAL%>36.8 degC   Temp Temporal TEMPORAL_1%> TEMPORAL%>   Temp Intravascular INTRAVASCULAR_1%> INTRAVASCULAR%>   Temp Axillary AXILLARY_1%> AXILLARY%>   Temp Rectal RECTAL_1%> RECTAL%>   02 Sat 100 % 96 %   Respiratory Rate RATE_1%>24 br/min RATE%>17 br/min   Peripheral Pulse Rate PULSE RATE_1%>97 bpm PULSE RATE%>78 bpm   Apical Heart Rate HEART RATE_1%> HEART RATE%>   Blood Pressure BLOOD PRESSURE_1%>/ BLOOD PRESSURE_1%>111 mmHg BLOOD PRESSURE%>160 mmHg / BLOOD PRESSURE%>93 mmHg                 Immunizations      No Immunizations Documented This Visit          DISCHARGE INFORMATION   Discharge Disposition: S Outpt-Admitted As Inpatient   Discharge Location:    [facility]   Discharge Date and Time:       ED Checkout Date and Time:    11/01/2018 11:47:20     DEPART REASON INCOMPLETE INFORMATION             Problems      No Problems Documented              Smoking Status      Former smoker, quit more than 30 days ago         PATIENT EDUCATION INFORMATION  Instructions:            Follow up:          ED PROVIDER DOCUMENTATION     Patient:   OTTAVIO, NOREM            MRN: [id number]            FIN: [id number]               Age:   52 years     Sex:  Male     DOB:  December 22, 1950   Associated Diagnoses:   Small bowel obstruction   Author:   MCNEILL-PA-C,  KATHERINE E      Basic Information   Time  seen: Provider Seen (ST)   ED Provider/Time:    MCNEILL-PA-C,  KATHERINE E / 11/01/2018 09:03  .   Additional information: Chief Complaint from Nursing Triage Note   Chief Complaint  Chief Complaint: Pt c/o n/v/d and abd pain  that began last night. Hx of Crohns. (11/01/18 08:34:00).      History of Present Illness   68 year old male with history of Crohn's disease here with his wife for abdominal pain, nausea, vomiting that began yesterday.  He reports a dull pressure in the generalized abdomen, worse in the right lower quadrant that has been constant since onset.  Worse with palpation and movement.  He reports that this overall feels similar in character to his Crohn's flares, although it is lasting longer than normal.  He is followed by [facility] and currently receives Remicade infusions for his Crohn's disease.  He has had 6 episodes of nonbloody vomit today with no diarrhea.  He has not had a bowel movement in the last couple of days.  He also admits associated chills but denies fever, chest pain, shortness of breath, urinary symptoms, testicular pain.  No attempted therapies for his pain thus far.  He denies alcohol and smoking of cigarettes, but does admit to smoking marijuana for the generalized nausea that he gets frequently at baseline.  He does take oxycodone and tramadol for pain at his baseline as well.  Abdominal surgeries are significant for prior partial bowel resection in the remote past..        Review of Systems   Constitutional symptoms:  No fever, no chills.    Skin symptoms:  No rash, no abrasions.    Eye symptoms:  Vision unchanged.   Respiratory symptoms:  Negative except as documented in HPI, No shortness of breath,    Cardiovascular symptoms:  Negative except as documented in HPI, No chest pain,    Gastrointestinal symptoms:  Abdominal pain, nausea, vomiting, constipation, no diarrhea, no rectal bleeding, no rectal pain.    Musculoskeletal symptoms:  No back pain, no Muscle pain.    Neurologic symptoms:  No headache, no dizziness, no numbness, no tingling, no focal weakness.    Hematologic/Lymphatic symptoms:  Bleeding tendency negative,    Allergy/immunologic symptoms:  Negative except as documented in HPI.              Additional review of systems information: All other systems reviewed and otherwise negative.      Health Status   Allergies:    Allergic Reactions (Selected)  No Known Allergies.   Medications:  (Selected)   Inpatient Medications  Ordered  Isovue-300: 100 mL, IV Contrast, Once  Sodium Chloride 0.9% bolus: 1,000 mL, IV Piggyback, Once  Toradol: 30 mg, 1 mL, IM, Once  Zofran: 4 mg, 2 mL, IV Push, Once  Prescriptions  Prescribed  Phenergan 12.5 mg rectal suppository: 12.5 mg, 1 supp, PR, q6hr, for 3 days, PRN: nausea, 12 supp, 0 Refill(s)  Zofran 4 mg oral tablet: 4 mg, 1 tabs, Oral, q4hr, for 3 days, PRN: nausea, 12 tabs, 0 Refill(s).      Past Medical/ Family/ Social History   Problem list:    No qualifying data available  .      Physical Examination               Vital Signs   Vital Signs   11/01/2018 8:40 EST Systolic Blood Pressure 169 mmHg  HI    Diastolic Blood Pressure 111 mmHg  >HHI  Peripheral Pulse Rate 97 bpm    Heart Rate Monitored 90 bpm    Respiratory Rate 18 br/min    Mean Arterial Pressure, Cuff 143 mmHg  HI    SpO2 100 %   11/01/2018 8:34 EST Temperature Oral 36.9 degC    Heart Rate Monitored 95 bpm    Respiratory Rate 24 br/min  HI    SpO2 100 %    Measurements   11/01/2018 8:35 EST Body Mass Index est meas 28.98 kg/m2    Body Mass Index Measured 28.98 kg/m2   11/01/2018 8:34 EST Height/Length Measured 182 cm    Weight Dosing 96 kg    Basic Oxygen Information   11/01/2018 8:40 EST SpO2 100 %    Oxygen Therapy Room air   11/01/2018 8:34 EST SpO2 100 %    Oxygen Therapy Room air    General:  Alert, no acute distress, WDWN, talking while sitting up in bed. .    Skin:  Warm, dry, intact, no rash.    Head:  Normocephalic, atraumatic.    Neck:  Supple.   Eye:  Vision unchanged.   Ears, nose, mouth and throat:  Oral mucosa moist.   Cardiovascular:  Regular rate and rhythm, No murmur, Normal peripheral perfusion, No edema.    Respiratory:  Lungs are clear to auscultation, respirations are non-labored,  breath sounds are equal.    Chest wall:  No deformity.   Back:  Normal range of motion.   Musculoskeletal:  Normal ROM, normal strength.    Gastrointestinal:  Soft, Non distended, Tenderness: Severe, generalized, Guarding: Negative, Rebound: Negative, Bowel sounds: Normal, Organomegaly: Negative, Trauma: Negative, Mass: Negative, Scars: Negative, Signs: None.    Neurological:  Alert and oriented to person, place, time, and situation, normal motor observed, normal speech observed.    Psychiatric:  Cooperative, appropriate mood & affect, normal judgment.       Medical Decision Making   Rationale:  PA/NP reviewed with co-signing physician: diagnosis and plan of care.   Documents reviewed:  Emergency department nurses' notes, prior records.    Results review:  Lab results : Lab View   11/01/2018 9:39 EST Estimated Creatinine Clearance 107.07 mL/min   11/01/2018 9:13 EST WBC 16.7 x10e3/mcL  HI    RBC 5.31 x10e6/mcL  HI    Hgb 16.7 g/dL    HCT 50.3 %    MCV 06.5 fL    MCH 31.5 pg    MCHC 33.7 g/dL    RDW 87.4 %    Platelet 339 x10e3/mcL    MPV 10.5 fL    Sodium Lvl 139 mmol/L    Potassium Lvl 3.9 mmol/L    Chloride 95 mmol/L  LOW    CO2 26 mmol/L    Glucose Random 151 mg/dL  HI    BUN 15 mg/dL    Creatinine Lvl 0.8 mg/dL    AGAP 18 mmol/L  HI    Osmolality Calc 281 mOsm/kg    Calcium Lvl 10.6 mg/dL  HI    Protein Total 8.8 g/dL  HI    Albumin Lvl 4.9 g/dL    Alk Phos 84 unit/L    AST 24 unit/L    ALT 18 unit/L    eGFR AA 107 mL/min/1.64m    eGFR Non-AA 92 mL/min/1.36m    Bili Total 0.61 mg/dL    Bili Direct <9.79 mg/dL    Lipase Lvl 887 unit/L  HI   11/01/2018 8:35 EST Estimated Creatinine Clearance 122.36 mL/min  Radiology results:  Rad Results (ST)   CT Abdomen Pelvis w/ Contrast  ?  11/01/18 10:18:37  CT abdomen pelvis with contrast: 11/01/18    INDICATION:Abdominal pain, unspecified. Nausea, vomiting and diarrhea,  history of Crohns disease    COMPARISON: 06/18/2018    TECHNIQUE: Routine protocol (Axial portal venous  phase imaging from the lung  bases to the pubic symphysis following IV contrast with coronal reconstruction.)  CT scanning was performed using radiation dose reduction techniques when  appropriate, per system protocols.    FINDINGS:  Lung bases clear. Liver, spleen, pancreas and both adrenal glands stable in  appearance. Prior cholecystectomy. Small hiatal hernia. Kidneys remain  nonobstructed. Interpolar left renal cyst again noted.  Ileocolonic anastomosis right lower quadrant. 6.5 cm segment of the patients  neoterminal ileum demonstrates stratification, luminal narrowing and mild mural  hyperemia. There is mild adjacent mesenteric vascular congestion. Fatty  proliferation ileocecal mesentery consistent with history of Crohns disease.  Small reactive ileocecal lymph nodes similar to prior. On axial images 50-52  there is slight tethering of a ventrally located nonthick-walled small bowel  loop without evidence for overt fistulization. There is increasing upstream  small bowel dilation, immediate upstream distal ileal loop diameter 3.5 cm.  There is no pneumatosis, no free air. No abnormal fluid collections. The colon  is relatively decompressed. Cannot exclude mild wall thickening rectosigmoid  colon similar to prior potentially chronic with no adjacent inflammatory  stranding.    IMPRESSION:    1. Findings most consistent with probable active enteritis involving a 6.5 cm  segment of the patients neoterminal ileum with associated stricture and  upstream small bowel obstruction. The luminal narrowing/obstruction could in  part be due to chronic inflammation, fibrosis of the patients neoterminal  ileum. No pneumatosis, no free air.  ?  Signed By: SUSI GAREN SQUIBB  ?  **************************************************  XR Abdomen AP, Post Procedure  ?  11/01/18 10:55:52  Portable AP semiupright lower chest upper abdomen for NG tube placement:  11/01/18    COMPARISON:    INDICATION: Encounter for fitting and  adjustment of other gastrointestinal  appliance and device,    FINDINGS/IMPRESSION: The NG tube is demonstrated coiled within the fundus of the  stomach.  ?  Signed By: ELLIOTT AGENT D  .   Notes:  CT scan reveals small bowel obstruction.  He will be admitted for inpatient evaluation and management.  NG tube has been placed and confirmed by x-ray.  Stable for admission..      Impression and Plan   Diagnosis   Small bowel obstruction (ICD10-CM K56.609, Discharge, Medical)   Plan   Disposition: Admit time  11/01/2018 11:19:00, Admit to Inpatient Unit, GUICHARD-MD,  JARED A.    Counseled: Patient, Family, Regarding diagnosis, Regarding diagnostic results, Regarding treatment plan, Patient indicated understanding of instructions.

## 2018-11-01 NOTE — Nursing Note (Signed)
Medication Administration Follow Up-Text       Medication Administration Follow Up Entered On:  11/01/2018 18:28 EST    Performed On:  11/01/2018 18:28 EST by Katrinka Blazing, RN, Maylon Peppers      Intervention Information:     ondansetron  Performed by Ronita Hipps, RN, [place] A on 11/01/2018 17:23:00 EST       ondansetron,4mg   IV Push,Antecubital, Right,nausea/vomiting       Med Response   ED Medication Response :   Symptoms improved   Gwenyth Allegra - 11/01/2018 18:28 EST

## 2018-11-01 NOTE — Nursing Note (Signed)
Medication Administration Follow Up-Text       Medication Administration Follow Up Entered On:  11/01/2018 18:28 EST    Performed On:  11/01/2018 18:27 EST by Katrinka Blazing, RN, Maylon Peppers      Intervention Information:     morphine  Performed by Ronita Hipps, RN, [place] A on 11/01/2018 16:38:00 EST       morphine,4mg   IV Push,Antecubital, Right,severe pain (8-10)       Med Response   FLACC Section :   Document assessment   Pasero Opioid Induced Sedation Scale :   S = Sleep, easy to arouse   Katrinka Blazing, RN, Glenna Fellows E - 11/01/2018 18:27 EST   Vital Signs   Respiratory Rate :   18 br/min   Oxygen Therapy :   Room air   [place], RN, Maylon Peppers - 11/01/2018 18:27 EST   FLACC   Face :   No particular expression or smile   Legs :   Normal position or relaxed   Activity :   Lying quietly, normal position, moves easily   Cry :   No cry, awake or asleep   Consolability :   Content, relaxed   Score :   0    SMITH, RN, Glenna Fellows E - 11/01/2018 18:27 EST

## 2018-11-01 NOTE — Nursing Note (Signed)
Medication Administration Follow Up-Text       Medication Administration Follow Up Entered On:  11/01/2018 11:25 EST    Performed On:  11/01/2018 11:25 EST by Fayrene Fearing, RN, Christina      Intervention Information:     morphine  Performed by Fayrene Fearing RN, Christina on 11/01/2018 10:17:00 EST       morphine,4mg   IV Push,Antecubital, Right       Med Response   ED Medication Response :   Symptoms improved   Lanelle Bal - 11/01/2018 11:25 EST

## 2018-11-01 NOTE — Progress Notes (Signed)
Pharmacy Clinical Interventions - Text       Pharmacy Clinical Interventions Entered On:  11/01/2018 14:17 EST    Performed On:  11/01/2018 14:16 EST by Josefine Class. D., Deepa V               Interventions   Intervention Type Pharmacy :   Order clarification   Associated Order(s) Pharmacy :   hydralazine   Clinical Importance Pharmacy :   Routine safety/clinical monitoring with no change needed   Medication Safety Reporting Pharmacy :   Non Medication Event   Pharmacy Additional Information :   BP parameters   Pharmacist Intervention Time :   1-5 Minutes   Patel, Pharm. D., Deepa V - 11/01/2018 14:16 EST

## 2018-11-01 NOTE — Nursing Note (Signed)
Medication Administration Follow Up-Text       Medication Administration Follow Up Entered On:  11/01/2018 13:33 EST    Performed On:  11/01/2018 13:33 EST by Katrinka Blazing, RN, Glenna Fellows E      Intervention Information:     morphine  Performed by Katrinka Blazing, RN, [place]BI[place] E on 11/01/2018 12:35:00 EST       morphine,4mg   IV Push,Antecubital, Right,severe pain (8-10)       Med Response   ED Medication Response :   Symptoms improved   Numeric Rating Pain Scale :   0 = No pain   Pasero Opioid Induced Sedation Scale :   S = Sleep, easy to arouse   Allyson Sabal E - 11/01/2018 13:33 EST   Vital Signs   Respiratory Rate :   15 br/min   Oxygen Therapy :   Room air   Gwenyth Allegra - 11/01/2018 13:33 EST

## 2018-11-01 NOTE — Nursing Note (Signed)
Medication Administration Follow Up-Text       Medication Administration Follow Up Entered On:  11/01/2018 22:13 EST    Performed On:  11/01/2018 21:09 EST by Carolina Cellar, RN, Heather      Intervention Information:     morphine  Performed by Carolina Cellar, RN, Heather on 11/01/2018 20:54:00 EST       morphine,4mg   IV Push,Antecubital, Right,severe pain (8-10)       Med Response   ED Medication Response :   No adverse reaction, No change in symptoms, Symptoms worsening   Numeric Rating Pain Scale :   10 = Worst possible pain   Pasero Opioid Induced Sedation Scale :   1 = Awake and alert   Carolina Cellar, RN, Heather - 11/01/2018 22:13 EST

## 2018-11-01 NOTE — ED Notes (Signed)
 ED Patient Summary                 [facility]  [address] [place], [place] [address]  [phone]  Discharge Instructions (Patient)  _______________________________________     Name:[name]  DOB:  Nov 20, 1950                   MRN: [id number]                   FIN: [id number]  Reason For Visit: Abdominal pain; N/ ACTIVE V/ABD PAIN/BACK PAIN  Final Diagnosis: Small bowel obstruction     Visit Date: 11/01/2018 08:27:00  Address: [address] [place] [address]  Phone: [phone]     Primary Care Provider:      Name: NONIE FALLOW III      Phone: [phone]        Emergency Department Providers:         Primary Physician:            [facility] would like to thank you for allowing us  to assist you with your healthcare needs. The following includes patient education materials and information regarding your injury/illness.     Follow-up Instructions: You were treated today on an emergency basis, it may be wise to contact your primary care provider to notify them of your visit today. You may have been referred to your regular doctor or a specialist, please follow up as instructed. If your condition worsens or you can't get in to see the doctor, contact the Emergency Department.       Printed Prescriptions:    Patient Education Materials:         Allergy Info: No Known Allergies     Medication Information:  [facility] ED Physicians provided you with a complete list of medications post discharge, if you have been instructed to stop taking a medication please ensure you also follow up with this information to your Primary Care Physician.  Unless otherwise noted, patient will continue to take medications as prescribed prior to the Emergency Room visit.  Any specific questions regarding your chronic medications and dosages should be discussed with your physician(s) and pharmacist.          ondansetron (Zofran 4 mg  oral tablet) 1 Tabs Oral (given by mouth) every 4 hours as needed nausea for 3 Days. Refills: 0.  promethazine (Phenergan 12.5 mg rectal suppository) 1 Suppositories Per rectum (in the rectum) every 6 hours as needed nausea for 3 Days. Refills: 0.      Medications Administered During Visit:              Medication Dose Route   Sodium Chloride 0.9% 1000 mL IV Piggyback   ondansetron 4 mg IV Push   iopamidol 100 mL IV Contrast   ketorolac 30 mg IV Push   morphine 4 mg IV Push          Major Tests and Procedures:  The following procedures and tests were performed during your ED visit.  PROCEDURES%>  PROCEDURES COMMENTS%>          Laboratory Orders  Name Status Details   .UA POC Completed Urine, RT, RT - Routine, Collected, 11/01/18 11:24:00 EST, Nurse collect, 11/01/18 11:24:00 US Robinette, RAL POC Login   BMP Completed Blood, Stat, ST - Stat, 11/01/18 9:12:00 EST, 11/01/18 9:12:00 EST, Nurse collect, MCNEILL-PA-C,  KATHERINE E, Print  label Y/N   CBC Completed Blood, Stat, ST - Stat, 11/01/18 9:12:00 EST, 11/01/18 9:12:00 EST, Nurse collect, MCNEILL-PA-C,  KATHERINE E, Print label Y/N   Hepatic Completed Blood, Stat, ST - Stat, 11/01/18 9:12:00 EST, 11/01/18 9:12:00 EST, Nurse collect, MCNEILL-PA-C,  KATHERINE E, Print label Y/N   Lipase Lvl Completed Blood, Stat, ST - Stat, 11/01/18 9:12:00 EST, 11/01/18 9:12:00 EST, Nurse collect, MCNEILL-PA-C,  KATHERINE E, Print label Y/N   XTube Blue Completed Blood, Stat, ST - Stat, Collected, 11/01/18 9:13:00 EST [id number], 11/01/18 9:13:00 EST, Nurse collect, Venous Draw, 11/01/18 9:29:00 EST, BH CP Login, MCNEILL-PA-C,  KATHERINE E, Print label Y/N, bh1_spec_lbl2, bh1_spec_lbl3, 1.8 mL Aolz/*[id number]*/,...               Radiology Orders  Name Status Details   CT Abdomen Pelvis w/ Contrast Completed 11/01/18 9:12:00 EST, STAT 1 hour or less, Reason: Abdominal pain, unspecified, Transport Mode: STRETCHER, pp_set_radiology_subspecialty   XR Abdomen AP, Post Procedure Completed  11/01/18 10:37:00 EST, STAT 1 hour or less, Reason: Encounter for fitting and adjustment of other gastrointestinal appliance and device, Transport Mode: STRETCHER, pp_set_radiology_subspecialty               Patient Care Orders  Name Status Details   Bed Request Communication Ordered 11/01/18 11:18:00 EST floor, small bowel obstruction   Change attending to Ordered 11/01/18 11:18:00 EST, BRIANT HICKS A   ED Assessment Adult Completed 11/01/18 8:35:53 EST, 11/01/18 8:35:53 EST   ED Secondary Triage Completed 11/01/18 8:35:53 EST, 11/01/18 8:35:53 EST   ED Triage Adult Completed 11/01/18 8:27:51 EST, 11/01/18 8:27:51 EST   Nasogastric/Orogastric Tube Insertion Completed 11/01/18 10:15:00 EST, 11/01/18 10:15:00 EST   POC-Urine Dipstick collect Completed 11/01/18 9:12:00 EST, Once, 11/01/18 9:12:00 EST   POC-Urine Dipstick collect Completed 11/01/18 10:08:00 EST, Once, 11/01/18 10:08:00 EST   Patient Specific Fall Safety Measures Completed 11/01/18 8:38:09 EST, Once, 11/01/18 8:38:09 EST, ED Low Fall Risk Documentation   Saline Lock Insert Completed 11/01/18 9:12:00 EST, Once, 11/01/18 9:12:00 EST   VTE Quality Measures Ordered 11/01/18 11:19:29 EST, 11/01/18 11:19:29 EST       ---------------------------------------------------------------------------------------------------------------------  [facility] allows you to manage your health, view your test results, and retrieve your discharge documents from your hospital stay securely and conveniently from your computer.     To begin the enrollment process, visit [email or web address] Click on "Sign up now" under [facility].   Comment:

## 2018-11-01 NOTE — ED Notes (Signed)
ED Triage Note       ED Triage Adult Entered On:  11/01/2018 8:35 EST    Performed On:  11/01/2018 8:34 EST by Fayrene Fearing, RN, Christina               Triage   Chief Complaint :   Pt c/o n/v and abd pain that began last night. Hx of Crohns.   Fayrene Fearing, RN, Christina - 11/01/2018 11:25 EST     Numeric Rating Pain Scale :   10 = Worst possible pain   [place] Mode of Arrival :   Ambulance   Temperature Oral :   36.9 degC(Converted to: 98.4 degF)    Heart Rate Monitored :   95 bpm   Respiratory Rate :   24 br/min (HI)    SpO2 :   100 %   Oxygen Therapy :   Room air   Patient presentation :   None of the above   Chief Complaint or Presentation suggest infection :   No   Weight Dosing :   96 kg(Converted to: 211 lb 10 oz)    Height :   182 cm(Converted to: 6 ft 0 in)    Body Mass Index Dosing :   29 kg/m2   ED General Section :   Document assessment   Pregnancy Status :   N/A   Lanelle Bal - 11/01/2018 8:34 EST   DCP GENERIC CODE   Tracking Group :   ED [facility] Tracking Group   Tracking Acuity :   3   Fayrene Fearing, RN, Trula Ore - 11/01/2018 8:34 EST   ED Allergies Section :   Document assessment   ED Reason for Visit Section :   Document assessment   ED Quick Assessment :   Patient appears awake, alert, oriented to baseline. Skin warm and dry. Moves all extremities. Respiration even and unlabored. Appears in no apparent distress.   Fayrene Fearing, RN, Christina - 11/01/2018 8:34 EST   Allergies   (As Of: 11/01/2018 08:35:53 EST)   Allergies (Active)   No Known Allergies  Estimated Onset Date:   Unspecified ; Created By:   Lavonda Jumbo; Reaction Status:   Active ; Category:   Drug ; Substance:   No Known Allergies ; Type:   Allergy ; Updated By:   Lavonda Jumbo; Reviewed Date:   11/01/2018 8:34 EST        Psycho-Social   Last 3 mo, thoughts killing self/others :   Patient denies   Lanelle Bal - 11/01/2018 8:34 EST   ED Reason for Visit   (As Of: 11/01/2018 08:35:53 EST)   Diagnoses(Active)    Abdominal pain  Date:   11/01/2018 ;  Diagnosis Type:   Reason For Visit ; Confirmation:   Complaint of ; Clinical Dx:   Abdominal pain ; Classification:   Medical ; Clinical Service:   Emergency medicine ; Code:   PNED ; Probability:   0 ; Diagnosis Code:   [id number]

## 2018-11-01 NOTE — ED Provider Notes (Signed)
Abdominal Pain *ED        Patient:   [name], [name]            MRN: [id number]            FIN: [id number]               Age:   68 years     Sex:  Male     DOB:  [birth year]   Associated Diagnoses:   Small bowel obstruction   Author:   [email or web address],  Aundra Millet      Basic Information   Time seen: Provider Seen (ST)   ED Provider/Time:    MCNEILL-PA-C,  Merie Wulf E / 11/01/2018 09:03  .   Additional information: Chief Complaint from Nursing Triage Note   Chief Complaint  Chief Complaint: Pt c/o n/v/d and abd pain that began last night. Hx of Crohns. (11/01/18 08:34:00).      History of Present Illness   68 year old male with history of Crohn's disease here with his wife for abdominal pain, nausea, vomiting that began yesterday.  He reports a dull pressure in the generalized abdomen, worse in the right lower quadrant that has been constant since onset.  Worse with palpation and movement.  He reports that this overall feels similar in character to his Crohn's flares, although it is lasting longer than normal.  He is followed by [facility] and currently receives Remicade infusions for his Crohn's disease.  He has had 6 episodes of nonbloody vomit today with no diarrhea.  He has not had a bowel movement in the last couple of days.  He also admits associated chills but denies fever, chest pain, shortness of breath, urinary symptoms, testicular pain.  No attempted therapies for his pain thus far.  He denies alcohol and smoking of cigarettes, but does admit to smoking marijuana for the generalized nausea that he gets frequently at baseline.  He does take oxycodone and tramadol for pain at his baseline as well.  Abdominal surgeries are significant for prior partial bowel resection in the remote past..        Review of Systems   Constitutional symptoms:  No fever, no chills.    Skin symptoms:  No rash, no abrasions.    Eye symptoms:  Vision unchanged.   Respiratory symptoms:  Negative except as documented in HPI, No shortness  of breath,    Cardiovascular symptoms:  Negative except as documented in HPI, No chest pain,    Gastrointestinal symptoms:  Abdominal pain, nausea, vomiting, constipation, no diarrhea, no rectal bleeding, no rectal pain.    Musculoskeletal symptoms:  No back pain, no Muscle pain.    Neurologic symptoms:  No headache, no dizziness, no numbness, no tingling, no focal weakness.    Hematologic/Lymphatic symptoms:  Bleeding tendency negative,    Allergy/immunologic symptoms:  Negative except as documented in HPI.             Additional review of systems information: All other systems reviewed and otherwise negative.      Health Status   Allergies:    Allergic Reactions (Selected)  No Known Allergies.   Medications:  (Selected)   Inpatient Medications  Ordered  Isovue-300: 100 mL, IV Contrast, Once  Sodium Chloride 0.9% bolus: 1,000 mL, IV Piggyback, Once  Toradol: 30 mg, 1 mL, IM, Once  Zofran: 4 mg, 2 mL, IV Push, Once  Prescriptions  Prescribed  Phenergan 12.5 mg rectal suppository: 12.5 mg, 1 supp, PR, q6hr, for 3  days, PRN: nausea, 12 supp, 0 Refill(s)  Zofran 4 mg oral tablet: 4 mg, 1 tabs, Oral, q4hr, for 3 days, PRN: nausea, 12 tabs, 0 Refill(s).      Past Medical/ Family/ Social History   Problem list:    No qualifying data available  .      Physical Examination               Vital Signs   Vital Signs   02/01/4331 9:51 EST Systolic Blood Pressure 884 mmHg  HI    Diastolic Blood Pressure 166 mmHg  >HHI    Peripheral Pulse Rate 97 bpm    Heart Rate Monitored 90 bpm    Respiratory Rate 18 br/min    Mean Arterial Pressure, Cuff 143 mmHg  HI    SpO2 100 %   11/01/2018 8:34 EST Temperature Oral 36.9 degC    Heart Rate Monitored 95 bpm    Respiratory Rate 24 br/min  HI    SpO2 100 %    Measurements   11/01/2018 8:35 EST Body Mass Index est meas 28.98 kg/m2    Body Mass Index Measured 28.98 kg/m2   11/01/2018 8:34 EST Height/Length Measured 182 cm    Weight Dosing 96 kg    Basic Oxygen Information   11/01/2018 8:40 EST SpO2 100 %     Oxygen Therapy Room air   11/01/2018 8:34 EST SpO2 100 %    Oxygen Therapy Room air    General:  Alert, no acute distress, WDWN, talking while sitting up in bed. .    Skin:  Warm, dry, intact, no rash.    Head:  Normocephalic, atraumatic.    Neck:  Supple.   Eye:  Vision unchanged.   Ears, nose, mouth and throat:  Oral mucosa moist.   Cardiovascular:  Regular rate and rhythm, No murmur, Normal peripheral perfusion, No edema.    Respiratory:  Lungs are clear to auscultation, respirations are non-labored, breath sounds are equal.    Chest wall:  No deformity.   Back:  Normal range of motion.   Musculoskeletal:  Normal ROM, normal strength.    Gastrointestinal:  Soft, Non distended, Tenderness: Severe, generalized, Guarding: Negative, Rebound: Negative, Bowel sounds: Normal, Organomegaly: Negative, Trauma: Negative, Mass: Negative, Scars: Negative, Signs: None.    Neurological:  Alert and oriented to person, place, time, and situation, normal motor observed, normal speech observed.    Psychiatric:  Cooperative, appropriate mood & affect, normal judgment.       Medical Decision Making   Rationale:  PA/NP reviewed with co-signing physician: diagnosis and plan of care.   Documents reviewed:  Emergency department nurses' notes, prior records.    Results review:  Lab results : Lab View   11/01/2018 9:39 EST Estimated Creatinine Clearance 107.07 mL/min   11/01/2018 9:13 EST WBC 16.7 x10e3/mcL  HI    RBC 5.31 x10e6/mcL  HI    Hgb 16.7 g/dL    HCT 49.6 %    MCV 93.4 fL    MCH 31.5 pg    MCHC 33.7 g/dL    RDW 12.5 %    Platelet 339 x10e3/mcL    MPV 10.5 fL    Sodium Lvl 139 mmol/L    Potassium Lvl 3.9 mmol/L    Chloride 95 mmol/L  LOW    CO2 26 mmol/L    Glucose Random 151 mg/dL  HI    BUN 15 mg/dL    Creatinine Lvl 0.8 mg/dL    AGAP 18 mmol/L  HI    Osmolality Calc 281 mOsm/kg    Calcium Lvl 10.6 mg/dL  HI    Protein Total 8.8 g/dL  HI    Albumin Lvl 4.9 g/dL    Alk Phos 84 unit/L    AST 24 unit/L    ALT 18 unit/L    eGFR AA 107  mL/min/1.78m???    eGFR Non-AA 92 mL/min/1.775m??    Bili Total 0.61 mg/dL    Bili Direct <0.20 mg/dL    Lipase Lvl 112 unit/L  HI   11/01/2018 8:35 EST Estimated Creatinine Clearance 122.36 mL/min    Radiology results:  Rad Results (ST)   CT Abdomen Pelvis w/ Contrast  ?  11/01/18 10:18:37  CT abdomen pelvis with contrast: 11/01/18    INDICATION:Abdominal pain, unspecified. Nausea, vomiting and diarrhea,  history of Crohns disease    COMPARISON: 06/18/2018    TECHNIQUE: Routine protocol (Axial portal venous phase imaging from the lung  bases to the pubic symphysis following IV contrast with coronal reconstruction.)  CT scanning was performed using radiation dose reduction techniques when  appropriate, per system protocols.    FINDINGS:  Lung bases clear. Liver, spleen, pancreas and both adrenal glands stable in  appearance. Prior cholecystectomy. Small hiatal hernia. Kidneys remain  nonobstructed. Interpolar left renal cyst again noted.  Ileocolonic anastomosis right lower quadrant. 6.5 cm segment of the patients  neoterminal ileum demonstrates stratification, luminal narrowing and mild mural  hyperemia. There is mild adjacent mesenteric vascular congestion. Fatty  proliferation ileocecal mesentery consistent with history of Crohns disease.  Small reactive ileocecal lymph nodes similar to prior. On axial images 50-52  there is slight tethering of a ventrally located nonthick-walled small bowel  loop without evidence for overt fistulization. There is increasing upstream  small bowel dilation, immediate upstream distal ileal loop diameter 3.5 cm.  There is no pneumatosis, no free air. No abnormal fluid collections. The colon  is relatively decompressed. Cannot exclude mild wall thickening rectosigmoid  colon similar to prior potentially chronic with no adjacent inflammatory  stranding.    IMPRESSION:    1. Findings most consistent with probable active enteritis involving a 6.5 cm  segment of the patients neoterminal ileum  with associated stricture and  upstream small bowel obstruction. The luminal narrowing/obstruction could in  part be due to chronic inflammation, fibrosis of the patients neoterminal  ileum. No pneumatosis, no free air.  ?  Signed By: CALorelei Pont?  **************************************************  XR Abdomen AP, Post Procedure  ?  11/01/18 10:55:52  Portable AP semiupright lower chest upper abdomen for NG tube placement:  11/01/18    COMPARISON:    INDICATION: Encounter for fitting and adjustment of other gastrointestinal  appliance and device,    FINDINGS/IMPRESSION: The NG tube is demonstrated coiled within the fundus of the  stomach.  ?  Signed By: WEMurray Hodgkins  .   Notes:  CT scan reveals small bowel obstruction.  He will be admitted for inpatient evaluation and management.  NG tube has been placed and confirmed by x-ray.  Stable for admission..      Impression and Plan   Diagnosis   Small bowel obstruction (ICD10-CM K56.609, Discharge, Medical)   Plan   Disposition: Admit time  11/01/2018 11:19:00, Admit to Inpatient Unit, GUICHARD-MD,  JANew Ulm  Counseled: Patient, Family, Regarding diagnosis, Regarding diagnostic results, Regarding treatment plan, Patient indicated understanding of instructions.    SiLibrarian, academicigned on 11/01/2018  11:20 AM EST   ________________________________________________   [id number],  Crimson Dubberly E      Electronically Signed on 11/01/2018 01:10 PM EST   ________________________________________________   [place] BARRON            Modified by: [email or web address],  Madelene Kaatz E on 11/01/2018 10:08 AM EST      Modified by: MCNEILL-PA-C,  Jevante Hollibaugh E on 11/01/2018 10:30 AM EST      Modified by: MCNEILL-PA-C,  Mehul Rudin E on 11/01/2018 11:20 AM EST

## 2018-11-01 NOTE — Nursing Note (Signed)
 Adult Patient History Form-Text       Adult Patient History Entered On:  11/01/2018 12:11 EST    Performed On:  11/01/2018 11:58 EST by CLAUDENE, RN, ROBIANN E               General Info   Patient Identified :   Identification band, Verbal   Patient Identified :   Juda   Information Given By :   Self, Spouse   Accompanied By :   Spouse   In Charge of News (ICON) Name :   Charlies Boon   Pregnancy Status :   N/A   In Charge of News (ICON) Code :   [phone]   Has the patient received chemotherapy or immunotherapy (cytotoxic)  in the last 48-72 hours? :   No   In Clinical Trial With Signed Consent for Related Condition :   No signed consent for clinical trial   Is the patient currently (2-3 days) receiving radiation treatment? :   No   SMITH, RN, ROBIANN E - 11/01/2018 11:58 EST   Allergies   (As Of: 11/01/2018 12:11:29 EST)   Allergies (Active)   No Known Allergies  Estimated Onset Date:   Unspecified ; Created By:   GABRIELLA HARLENE HERO; Reaction Status:   Active ; Category:   Drug ; Substance:   No Known Allergies ; Type:   Allergy ; Updated By:   GABRIELLA HARLENE HERO; Reviewed Date:   11/01/2018 12:00 EST        Medication History   Medication List   (As Of: 11/01/2018 12:11:29 EST)   Normal Order    methylPREDNISolone  40 mg preservative-free Inj  :   methylPREDNISolone  40 mg preservative-free Inj ; Status:   Ordered ; Ordered As Mnemonic:   methylPREDNISolone  ; Simple Display Line:   60 mg, 1.5 EA, IV Push, BID ; Ordering Provider:   BRIANT HICKS A; Catalog Code:   methylPREDNISolone  ; Order Dt/Tm:   11/01/2018 11:56:52 EST          piperacillin-tazobactam  :   piperacillin-tazobactam ; Status:   Ordered ; Ordered As Mnemonic:   piperacillin-tazobactam extended infusion ( Zosyn ) ; Simple Display Line:   3.375 g, IV Piggyback, q8hr ; Ordering Provider:   BRIANT,  JARED A; Catalog Code:   piperacillin-tazobactam ; Order Dt/Tm:   11/01/2018 11:57:32 EST          ondansetron 2 mg/mL Inj Soln 2 mL  :   ondansetron 2 mg/mL Inj  Soln 2 mL ; Status:   Completed ; Ordered As Mnemonic:   Zofran ; Simple Display Line:   4 mg, 2 mL, IV Push, Once ; Ordering Provider:   MCNEILL-PA-C,  KATHERINE E; Catalog Code:   ondansetron ; Order Dt/Tm:   11/01/2018 10:25:54 EST          morphine 4 mg/mL preservative-free Sol 1 mL  :   morphine 4 mg/mL preservative-free Sol 1 mL ; Status:   Completed ; Ordered As Mnemonic:   morphine ; Simple Display Line:   4 mg, 1 mL, IV Push, Once ; Ordering Provider:   MCNEILL-PA-C,  KATHERINE E; Catalog Code:   morphine ; Order Dt/Tm:   11/01/2018 10:08:17 EST          ketorolac 30 mg/mL Inj Soln 1 mL  :   ketorolac 30 mg/mL Inj Soln 1 mL ; Status:   Completed ; Ordered As Mnemonic:   Toradol ; Simple Display  Line:   30 mg, 1 mL, IV Push, Once ; Ordering Provider:   MCNEILL-PA-C,  KATHERINE E; Catalog Code:   ketorolac ; Order Dt/Tm:   11/01/2018 09:18:56 EST          iopamidol 61% Inj Soln 100 mL  :   iopamidol 61% Inj Soln 100 mL ; Status:   Completed ; Ordered As Mnemonic:   Isovue-300 ; Simple Display Line:   100 mL, IV Contrast, Once ; Ordering Provider:   MCNEILL-PA-C,  KATHERINE E; Catalog Code:   iopamidol ; Order Dt/Tm:   11/01/2018 09:12:38 EST          ketorolac 30 mg/mL Inj Soln 1 mL  :   ketorolac 30 mg/mL Inj Soln 1 mL ; Status:   Discontinued ; Ordered As Mnemonic:   Toradol ; Simple Display Line:   30 mg, 1 mL, IM, Once ; Ordering Provider:   MCNEILL-PA-C,  KATHERINE E; Catalog Code:   ketorolac ; Order Dt/Tm:   11/01/2018 09:12:51 EST          ondansetron 2 mg/mL Inj Soln 2 mL  :   ondansetron 2 mg/mL Inj Soln 2 mL ; Status:   Completed ; Ordered As Mnemonic:   Zofran ; Simple Display Line:   4 mg, 2 mL, IV Push, Once ; Ordering Provider:   MCNEILL-PA-C,  KATHERINE E; Catalog Code:   ondansetron ; Order Dt/Tm:   11/01/2018 09:12:38 EST          Sodium Chloride 0.9% intravenous solution Bolus  :   Sodium Chloride 0.9% intravenous solution Bolus ; Status:   Completed ; Ordered As Mnemonic:   Sodium Chloride 0.9% bolus  ; Simple Display Line:   1,000 mL, 1000 mL/hr, IV Piggyback, Once ; Ordering Provider:   MCNEILL-PA-C,  KATHERINE E; Catalog Code:   Sodium Chloride 0.9% ; Order Dt/Tm:   11/01/2018 09:12:37 EST            Prescription/Discharge Order    promethazine  :   promethazine ; Status:   Prescribed ; Ordered As Mnemonic:   Phenergan 12.5 mg rectal suppository ; Simple Display Line:   12.5 mg, 1 supp, PR, q6hr, for 3 days, PRN: nausea, 12 supp, 0 Refill(s) ; Ordering Provider:   RODGERS ILA GAILS; Catalog Code:   promethazine ; Order Dt/Tm:   06/18/2018 16:39:05 EDT          ondansetron  :   ondansetron ; Status:   Prescribed ; Ordered As Mnemonic:   Zofran 4 mg oral tablet ; Simple Display Line:   4 mg, 1 tabs, Oral, q4hr, for 3 days, PRN: nausea, 12 tabs, 0 Refill(s) ; Ordering Provider:   RODGERS ILA GAILS; Catalog Code:   ondansetron ; Order Dt/Tm:   06/18/2018 16:38:50 EDT            Home Meds    acetaminophen-oxyCODONE  :   acetaminophen-oxyCODONE ; Status:   Documented ; Ordered As Mnemonic:   Percocet 7.5/325 oral tablet ; Simple Display Line:   2 tabs, Oral, q6hr, PRN, 0 Refill(s) ; Catalog Code:   acetaminophen-oxyCODONE ; Order Dt/Tm:   11/01/2018 12:03:11 EST ; Comment:   MAX DAILY DOSE OF ACETAMINOPHEN = 4000 MG          ALPRAZolam   :   ALPRAZolam  ; Status:   Documented ; Ordered As Mnemonic:   ALPRAZolam  0.5 mg oral tablet ; Simple Display Line:   0.5 mg, 1 tabs, Oral,  TID, PRN: for anxiety, 0 Refill(s) ; Catalog Code:   ALPRAZolam  ; Order Dt/Tm:   11/01/2018 12:02:29 EST          amLODIPine-valsartan  :   amLODIPine-valsartan ; Status:   Documented ; Ordered As Mnemonic:   Exforge 10 mg-320 mg oral tablet ; Simple Display Line:   1 tabs, Oral, Daily, 30 tabs, 0 Refill(s) ; Catalog Code:   amLODIPine-valsartan ; Order Dt/Tm:   11/01/2018 12:02:51 EST          dicyclomine  :   dicyclomine ; Status:   Documented ; Ordered As Mnemonic:   dicyclomine 10 mg oral capsule ; Simple Display Line:   10 mg, 1 caps, Oral, QID, 40  caps, 0 Refill(s) ; Catalog Code:   dicyclomine ; Order Dt/Tm:   11/01/2018 12:03:38 EST ; Comment:    THIS MEDICATION IS ASSOCIATED   WITH   AN INCREASED RISK OF FALLS.          traMADol  :   traMADol ; Status:   Documented ; Ordered As Mnemonic:   traMADol 50 mg oral tablet ; Simple Display Line:   50 mg, 1 tabs, Oral, q8hr, PRN: as needed for pain, 0 Refill(s) ; Catalog Code:   traMADol ; Order Dt/Tm:   11/01/2018 12:03:57 EST            Problem History   (As Of: 11/01/2018 12:11:29 EST)   Problems(Active)    Arthritis (SNOMED CT  :2721985 )  Name of Problem:   Arthritis ; Recorder:   CLAUDENE, RN, ROBIANN E; Confirmation:   Confirmed ; Classification:   Patient Stated ; Code:   2721985 ; Contributor System:   PowerChart ; Last Updated:   11/01/2018 12:10 EST ; Life Cycle Date:   11/01/2018 ; Life Cycle Status:   Active ; Vocabulary:   SNOMED CT        Crohn disease (SNOMED CT  :43234983 )  Name of Problem:   Crohn disease ; Recorder:   SMITH, RN, ROBIANN E; Confirmation:   Confirmed ; Classification:   Patient Stated ; Code:   43234983 ; Contributor System:   PowerChart ; Last Updated:   11/01/2018 12:05 EST ; Life Cycle Date:   11/01/2018 ; Life Cycle Status:   Active ; Vocabulary:   SNOMED CT        Hypertension (SNOMED CT  :8784255987 )  Name of Problem:   Hypertension ; Recorder:   CLAUDENE, RN, ROBIANN E; Confirmation:   Confirmed ; Classification:   Patient Stated ; Code:   8784255987 ; Contributor System:   PowerChart ; Last Updated:   11/01/2018 12:05 EST ; Life Cycle Date:   11/01/2018 ; Life Cycle Status:   Active ; Vocabulary:   SNOMED CT          Diagnoses(Active)    Abdominal pain  Date:   11/01/2018 ; Diagnosis Type:   Reason For Visit ; Confirmation:   Complaint of ; Clinical Dx:   Abdominal pain ; Classification:   Medical ; Clinical Service:   Emergency medicine ; Code:   PNED ; Probability:   0 ; Diagnosis Code:   [id number]      Small bowel obstruction  Date:   11/01/2018 ; Diagnosis Type:    Discharge ; Confirmation:   Confirmed ; Clinical Dx:   Small bowel obstruction ; Classification:   Medical ; Clinical Service:   Non-Specified ; Code:   ICD-10-CM ; Probability:   0 ;  Diagnosis Code:   K56.609        Procedure History        -    Procedure History   (As Of: 11/01/2018 12:11:29 EST)     Immunizations   Influenza Vaccine Status :   Received prior to admission, during current flu season   Influenza Vaccine Status :   Unknown   SMITH, RN, ROBIANN E - 11/01/2018 11:58 EST   ID Risk Screen Symptoms   Recent Travel History :   No recent travel   TB Symptom Screen :   No symptoms   C. diff Symptom/History ID :   Neither of the above   MRSA/VRE Screening :   None of these apply   SMITH, RN, ROBIANN E - 11/01/2018 11:58 EST   Bloodless Medicine   Will Patient Accept Blood Transfusion and/or Blood Products :   Yes   SMITH, RN, ROBIANN E - 11/01/2018 11:58 EST   Nutrition   MST Does Your Current Diet Include :   None   MST Have You Recently Lost Weight Without Trying? :   No   MST Weight Loss Score :   0    CLAUDENE RN, ROBIANN E - 11/01/2018 11:58 EST   Functional   Sensory Deficits :   Other: contacts/glasses   SMITH, RN, ROBIANN E - 11/01/2018 11:58 EST   Social History   Social History   (As Of: 11/01/2018 12:11:30 EST)   Tobacco:        Tobacco use: Former smoker, quit more than 30 days ago.  20 year(s).   (Last Updated: 11/01/2018 08:37:41 EST by Lynwood RN, Tawni)          Alcohol:        Denies   (Last Updated: 11/01/2018 08:37:48 EST by Lynwood RN, Tawni)          Substance Abuse:        Current, Marijuana, Daily   Comments:  11/01/2018 8:38 - Lynwood, RN, Christina: last use per pt 10/31/18   (Last Updated: 11/01/2018 12:09:01 EST by CLAUDENE, RN, RAY BRAVO)            Spiritual   Faith/Denomination :   Baptist   Do you have a concern that you would like to address with a Chaplain? :   No   Do you have any religious/spiritual/cultural beliefs that could impact the way your care is provided? :   No   SMITH, RN, ROBIANN E -  11/01/2018 11:58 EST   Harm Screen   Feels Unsafe at Home :   No   Last 3 mo, thoughts killing self/others :   Patient denies   SMITH, RN, ROBIANN E - 11/01/2018 11:58 EST   Advance Directive   Advance Directive :   No   Patient Wishes to Receive Further Information on Advance Directives :   No   SMITH, RN, ROBIANN E - 11/01/2018 11:58 EST   Education   Written Language :   Isadora   Caregiver/Advocate Primary Language :   Isadora   Caregiver/Advocate Written Language :   Isadora   Primary Language :   Isadora CLAUDENE, RN, ROBIANN E - 11/01/2018 11:58 EST   Caregiver/Advocate Language   Patient :   Verbal explanation, Demonstration   Family :   Demonstration, Verbal explanation   CLAUDENE RN, ROBIANN E - 11/01/2018 11:58 EST   Barriers to Learning :   None evident   Teaching Method :  Explanation   Responsible Learner Present for Session :   Yes   Additional Session Learner(s) Present :   Spouse   SMITH, RN, ROBIANN E - 11/01/2018 11:58 EST   Preventative Measures Information   Unit/Room Orientation :   Verbalizes understanding   Environmental Safety :   Verbalizes understanding   Hand Washing :   Verbalizes understanding   Infection Prevention :   Verbalizes understanding   DVT Prophylaxis :   Verbalizes understanding   Isolation Precaution :   Verbalizes understanding   SMITH, RN, ROBIANN E - 11/01/2018 11:58 EST   DC Needs   Living Situation :   Home with family support   Home Equipment Rehab :   [facility] Caregiver Name/Relationship :   Trayven Lumadue   Current Living Situation :   [phone]   Anticipated Discharge Needs :   None   SMITH, RN, RAY BRAVO - 11/01/2018 11:58 EST   Valuables and Belongings   Does Patient Have Valuables and Belongings :   Yes   SMITH, RN, RAY BRAVO - 11/01/2018 11:58 EST   Valuables and Belongings   At Bedside :   Cherylann Hipp phone   Lillie, RN, ROBIANN E - 11/01/2018 11:58 EST   Patient Search Completed :   NA   SMITH, RN, ROBIANN E - 11/01/2018 11:58 EST   Admission Complete   Admission Complete :    Yes   CLAUDENE RN, ROBIANN E - 11/01/2018 11:58 EST

## 2018-11-01 NOTE — Nursing Note (Signed)
Medication Administration Follow Up-Text       Medication Administration Follow Up Entered On:  11/01/2018 9:41 EST    Performed On:  11/01/2018 9:41 EST by Fayrene Fearing, RN, Trula Ore      Intervention Information:     ondansetron  Performed by Gerre Pebbles, RN, MORGAN E on 11/01/2018 09:16:00 EST       ondansetron,4mg   IV Push,Antecubital, Left       Med Response   ED Medication Response :   Symptoms improved   Lanelle Bal - 11/01/2018 9:41 EST

## 2018-11-01 NOTE — ED Notes (Signed)
ED Patient Education Note     Patient Education Materials Follows:

## 2018-11-01 NOTE — ED Notes (Signed)
ED Triage Note       ED Secondary Triage Entered On:  11/01/2018 8:38 EST    Performed On:  11/01/2018 8:35 EST by Fayrene Fearing, RN, Christina               General Information   Barriers to Learning :   None evident   ED Home Meds Section :   Document assessment   [facility] ED Fall Risk Section :   Document assessment   ED History Section :   Document assessment   Infectious Disease Documentation :   Document assessment   ED Advance Directives Section :   Document assessment   [name] - 11/01/2018 8:35 EST   (As Of: 11/01/2018 08:38:08 EST)   Diagnoses(Active)    Abdominal pain  Date:   11/01/2018 ; Diagnosis Type:   Reason For Visit ; Confirmation:   Complaint of ; Clinical Dx:   Abdominal pain ; Classification:   Medical ; Clinical Service:   Emergency medicine ; Code:   PNED ; Probability:   0 ; Diagnosis Code:   [id number]             -    Procedure History   (As Of: 11/01/2018 08:38:09 EST)     Phoebe Perch Fall Risk Assessment Tool   Hx of falling last 3 months ED Fall :   No   Patient confused or disoriented ED Fall :   No   Patient intoxicated or sedated ED Fall :   No   Patient impaired gait ED Fall :   No   Use a mobility assistance device ED Fall :   Yes   Patient altered elimination ED Fall :   No   [facility] ED Fall Score :   1    [name] - 11/01/2018 8:35 EST   ED Advance Directive   Advance Directive :   No   Fayrene Fearing RN, Christina - 11/01/2018 8:35 EST   Social History   Social History   (As Of: 11/01/2018 08:38:09 EST)   Tobacco:        Tobacco use: Former smoker, quit more than 30 days ago.  20 year(s).   (Last Updated: 11/01/2018 08:37:41 EST by Fayrene Fearing, RN, Trula Ore)          Alcohol:        Denies   (Last Updated: 11/01/2018 08:37:48 EST by Fayrene Fearing RN, Trula Ore)          Substance Abuse:        Current, Marijuana   Comments:  11/01/2018 8:38 - Fayrene Fearing, RN, Christina: last use per pt 10/31/18   (Last Updated: 11/01/2018 08:38:05 EST by Fayrene Fearing, RN, Christina)            ID Risk Screen  Symptoms   Recent Travel History :   No recent travel   TB Symptom Screen :   No symptoms   C. diff Symptom/History ID :   Neither of the above   Fayrene Fearing, RN, Trula Ore - 11/01/2018 8:35 EST   Med Hx   Medication List   (As Of: 11/01/2018 08:38:09 EST)   Prescription/Discharge Order    promethazine  :   promethazine ; Status:   Prescribed ; Ordered As Mnemonic:   Phenergan 12.5 mg rectal suppository ; Simple Display Line:   12.5 mg, 1 supp, PR, q6hr, for 3 days, PRN: nausea, 12 supp, 0 Refill(s) ; Ordering Provider:   Soyla Dryer; Catalog Code:   promethazine ;  Order Dt/Tm:   06/18/2018 16:39:05 EDT          ondansetron  :   ondansetron ; Status:   Prescribed ; Ordered As Mnemonic:   Zofran 4 mg oral tablet ; Simple Display Line:   4 mg, 1 tabs, Oral, q4hr, for 3 days, PRN: nausea, 12 tabs, 0 Refill(s) ; Ordering Provider:   Soyla Dryer; Catalog Code:   ondansetron ; Order Dt/Tm:   06/18/2018 16:38:50 EDT

## 2018-11-01 NOTE — Nursing Note (Signed)
Medication Administration Follow Up-Text       Medication Administration Follow Up Entered On:  11/01/2018 9:42 EST    Performed On:  11/01/2018 9:41 EST by Fayrene Fearing, RN, Christina      Intervention Information:     ketorolac  Performed by Gerre Pebbles, RN, MORGAN E on 11/01/2018 09:19:00 EST       ketorolac,30mg   IV Push,Antecubital, Right       Med Response   Numeric Rating Pain Scale :   1   [name] - 11/01/2018 9:41 EST

## 2018-11-02 LAB — CBC WITH AUTO DIFFERENTIAL
Absolute Baso #: 0 10*3/uL (ref 0.0–0.2)
Absolute Eos #: 0 10*3/uL (ref 0.0–0.5)
Absolute Lymph #: 1.5 10*3/uL (ref 1.0–3.2)
Absolute Mono #: 0.9 10*3/uL (ref 0.3–1.0)
Basophils %: 0.1 % (ref 0.0–2.0)
Eosinophils %: 0 % (ref 0.0–7.0)
Hematocrit: 37.5 % — ABNORMAL LOW (ref 38.0–52.0)
Hemoglobin: 13 g/dL (ref 12.0–17.3)
Immature Grans (Abs): 0 10*3/uL
Immature Granulocytes: 0.2 %
Lymphocytes: 12.9 % — ABNORMAL LOW (ref 15.0–45.0)
MCH: 32.9 pg (ref 27.0–34.5)
MCHC: 34.7 g/dL (ref 32.0–36.0)
MCV: 94.9 fL (ref 84.0–100.0)
MPV: 9.6 fL (ref 7.2–13.2)
Monocytes: 8 % (ref 4.0–12.0)
Neutrophils %: 78.8 % — ABNORMAL HIGH (ref 42.0–74.0)
Neutrophils Absolute: 8.9 10*3/uL — ABNORMAL HIGH (ref 1.6–7.3)
Platelets: 308 10*3/uL (ref 140–440)
RBC: 3.95 x10e6/mcL — ABNORMAL LOW (ref 4.00–5.20)
RDW: 12.8 % (ref 11.0–16.0)
WBC: 11.3 10*3/uL — ABNORMAL HIGH (ref 3.8–10.6)

## 2018-11-02 LAB — COMPREHENSIVE METABOLIC PANEL
ALT: 13 U/L (ref 0–41)
AST: 15 U/L (ref 0–40)
Albumin/Globulin Ratio: 2 mmol/L (ref 1.00–2.00)
Albumin: 3.9 g/dL (ref 3.5–5.2)
Alk Phosphatase: 57 U/L (ref 40–130)
Anion Gap: 13 mmol/L (ref 2–17)
BUN: 24 mg/dL — ABNORMAL HIGH (ref 8–23)
CO2: 26 mmol/L (ref 22–29)
Calcium: 9.1 mg/dL (ref 8.8–10.2)
Chloride: 104 mmol/L (ref 98–107)
Creatinine: 0.9 mg/dL (ref 0.7–1.3)
GFR African American: 102 mL/min/{1.73_m2} (ref >=90)
GFR Non-African American: 88 mL/min/{1.73_m2} — ABNORMAL LOW (ref >=90)
Globulin: 3 g/dL (ref 1.9–4.4)
Glucose: 137 mg/dL — ABNORMAL HIGH (ref 70–99)
OSMOLALITY CALCULATED: 291 mOsm/kg — ABNORMAL HIGH (ref 270–287)
Potassium: 4.1 mmol/L (ref 3.5–5.3)
Sodium: 143 mmol/L (ref 135–145)
Total Bilirubin: 0.52 mg/dL (ref 0.00–1.20)
Total Protein: 6.4 g/dL (ref 6.4–8.3)

## 2018-11-02 LAB — ADD ON LAB TEST

## 2018-11-02 LAB — C-REACTIVE PROTEIN: CRP: 1.1 mg/dL — ABNORMAL HIGH (ref 0.0–0.5)

## 2018-11-02 LAB — SEDIMENTATION RATE: Sed Rate: 30 mm/hr — ABNORMAL HIGH (ref 0–20)

## 2018-11-02 NOTE — Consults (Signed)
Consultation    [facility]  Yevette Edwards, MD  Service Date: 11/02/2018    CHIEF COMPLAINT:  Crohn's disease and bowel obstruction.    HISTORY OF PRESENT ILLNESS:  A 68 year old male patient was seen upon  request of the hospitalist service and surgical service for management  of Crohn's disease with bowel obstruction.  He has had Crohn's disease  for about 40 years.  The patient required resection of the terminal  ileum about 30 years ago.  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  The patient was off medical  treatment for several years due to lack of medical insurance after he  lost his job.  He has been on Remicade infusions now for 3 months.   The patient was doing well symptomatically until recently when he had  a heavy high residue meal with cold and greens.  He presented to the  Emergency Room with abdominal pain, nausea and vomiting.  CT scan  showed obstruction of the distal ileum.  The patient was seen by Dr.  Shelva Majestic in surgical consultation.  His symptoms improved and completely  resolved with IV hydration and NG tube suction.  NG tube was  subsequently removed.  Now, he feels back to his baseline.    GENERAL REVIEW OF SYSTEMS:  A normal 14-point male review of systems.    CURRENT MEDICATIONS:  IV fluids, alprazolam, dicyclomine, Percocet,  Exforge, Phenergan, tramadol.    ALLERGIES:  No known medication allergies or reactions.    SOCIAL HISTORY:  Uses marijuana.  He is a former smoker.    FAMILY HISTORY:  Negative for inflammatory bowel disease and colon  cancer.    PHYSICAL EXAMINATION:  VITAL SIGNS:  Temperature 97, blood pressure  140/90, heart rate 70, BMI 28.98.  GENERAL:  No apparent distress.   Anicteric.  Alert and oriented times 3.  Muscle tone and skin turgor  are normal.  HEENT:  Extraocular movements are intact.  Pupils equal,  reactive.  Mouth moist without lesions.  NECK:  Supple, without  thyromegaly or adenopathy.  CHEST:  Clear on percussion  and  auscultation.  ABDOMEN:  Soft, nondistended with mild diffuse  tenderness.  Bowel sounds are normoactive.  There is no rebound,  guarding or palpable masses.  RECTAL:  Deferred.    IMPRESSION:  1.  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  2.  Status post terminal ileum resection for Crohn's disease.    PLAN:  Avoid long-term use of steroids.  Advance diet to low residue  diet.  Continue Remicade infusions.  Check drug level and antibodies  and follow up in the office.      Yevette Edwards, MD  TR: tn DD: 11/02/2018 11:59 TD: 11/02/2018 12:06 Job#: [id number]  \\[id number]#: [id number]  \\[id number]\\  Signature Line    Electronically Signed on 11/27/2018 01:04 PM EST  ________________________________________________  Siona Coulston-MD,  Illene Regulus

## 2018-11-02 NOTE — Nursing Note (Signed)
Medication Administration Follow Up-Text       Medication Administration Follow Up Entered On:  11/02/2018 0:34 EST    Performed On:  11/01/2018 22:19 EST by Carolina Cellar, RN, Heather      Intervention Information:     ketorolac  Performed by Carolina Cellar, RN, Heather on 11/01/2018 22:04:00 EST       ketorolac,30mg   IV Push,Antecubital, Right,moderate pain (4-7)       Med Response   ED Medication Response :   No adverse reaction, No change in symptoms   Carolina Cellar, RN, Heather - 11/02/2018 0:34 EST

## 2018-11-02 NOTE — Case Communication (Signed)
CM D/C Planning Assessment Ongoing- Text       CM Admission Assessment Entered On:  11/02/2018 13:16 EST    Performed On:  11/02/2018 10:50 EST by Marlou Sa, RN, Calton Dach               CM Admission Assessment   CM Reason for Care Management Referral :   Discharge planning assessment   CM Insurance Information :   Medicare, [employer]. Name :   Medicare and [employer] Medicare Supplement   CM Name of Patient Pharmacy :   [facility] [address] [address] [place]   CM Date and Time Inpatient Order :   11/01/2018 11:18 EST   CM Patient has PCP :   Yes   CM Name of PCP :   Fredric Dine   CM Discharge Transporation Needs :   No   CM Assessment Discussion With :   Patient   CM Home/Lay Caregiver Name/Relationship :   Joseph Art, spouse   CM Home/Lay Caregiver Contact Number :   [phone] or [phone]   Living Situation :   Home independently, Home with family support   CM Patient Admitted From :   Home with wife and grandson   Home Equipment Rehab :   Cane   CM Initial Tentative Discharge Plan :   Return home with wife, no dc needs identifed   CM Anticipated Hosp Related Barriers to DC :   None identified   CM Home Barriers :   None   Anticipated Discharge Date :   11/03/2018 EST   CM Progress Note :   11/02/18 TJD 68 y.o. male presented with abd pain, back pain and vomiting.  admitted for active enteritis and bowel obstruction.  NGT placed, npo, ivf, iv morphine.  per md, pt better today and tolerating clears, NGT removed.  per md, plan for dc home tomorrow.  cm met with pt at bedside, he is a/ox 3, states he lives at home with his wife and grandson, I adl's.  he uses a cane, no other dme.  no hh services.  dc plans are to return home with his wife at discharge.  no dc needs identified.  cm dept will cont to follow & assist as needed.  IM signed on 2/4.       CM Admission Assessment Complete :   Yes   Marlou Sa, RN, Calton Dach - 11/02/2018 13:10 EST

## 2018-11-02 NOTE — Progress Notes (Signed)
Progress Note-Nurse               Patient has expressed many times through this shift his displeasure with the care he has received. He is mainly upset because he feels there is no communication about his care from provider to provider. He is upset that the MD who spoke with him today told him he would be given a regular diet yet he received liquid and full liquid for lunch and dinner. I tried to offer other options and informed him his diet was now advanced but he did not care. Patient is refusing all medications and interventions and has made his intention of leaving first thing in the morning very clear. He will not accept his antibiotics or pain meds that he initially asked for. His wife met me in the hall and asked me to please have his discharge paperwork complete by shift change, because she does not want him checking out AMA but she knows he will.   Librarian, academic Signed on 11/02/2018 10:11 PM EST   ________________________________________________   Stephan Minister, RN, Nira Conn

## 2018-11-02 NOTE — Nursing Note (Signed)
Medication Administration Follow Up-Text       Medication Administration Follow Up Entered On:  11/02/2018 10:59 EST    Performed On:  11/02/2018 9:30 EST by Ronita Hipps, RN, [place] A      Intervention Information:     morphine  Performed by Ronita Hipps, RN, [place] A on 11/02/2018 09:15:00 EST       morphine,4mg   IV Push,Antecubital, Right,severe pain (8-10)       Med Response   ED Medication Response :   No adverse reaction   Numeric Rating Pain Scale :   0 = No pain   [place] Opioid Induced Sedation Scale :   1 = Awake and alert   Respiratory Rate :   18 br/min   Pain Description Section :   Document assessment   [place], RN, [place] A - 11/02/2018 10:58 EST

## 2018-11-02 NOTE — Nursing Note (Signed)
Medication Administration Follow Up-Text       Medication Administration Follow Up Entered On:  11/02/2018 4:57 EST    Performed On:  11/02/2018 3:06 EST by Carolina Cellar, RN, Heather      Intervention Information:     morphine  Performed by Carolina Cellar, RN, Heather on 11/02/2018 02:51:00 EST       morphine,4mg   IV Push,Antecubital, Right,severe pain (8-10)       Med Response   ED Medication Response :   No adverse reaction, Symptoms improved, Continue to observe for symptoms   Respiratory Rate :   16 br/min   Carolina Cellar, RN, Heather - 11/02/2018 4:56 EST

## 2018-11-03 NOTE — Discharge Summary (Signed)
 Inpatient Patient Summary                 [facility]  [address]  [place], [place] [address]  [id number]  Patient Discharge Instructions     Name: [name], [name]  Current Date: 11/03/2018 92:67:[id number]  DOB: 01-22-1951 [id number] FIN:[id number]%>[phone]  Patient Address: [address] [place] [facility] [address]  Patient Phone: [phone]  Primary Care Provider:  Name: [name]  Phone: [phone]  Immunizations Provided:      Discharge Diagnosis: 1:Enteritis; 2:Obstruction of bowel; 3:Crohn's colitis; HTN (hypertension)  Discharged To: TO, ANTICIPATED%>  Home Treatments: TREATMENTS, ANTICIPATED%>  Devices/Equipment: EQUIPMENT REHAB%>Cane  Post Hospital Services: HOSPITAL SERVICES%>  Professional Skilled Services: SKILLED SERVICES%>  Therapist, sports and Community Resources: SERV AND COMM RES, ANTICIPATED%>  Mode of Discharge Transportation: TRANSPORTATION%>  Discharge Orders:          Discharge Patient 11/03/18 7:31:00 EST, Discharge Home/Self Care         Comment:   Medications  During the course of your visit, your medication list was updated with the most current information. The details of those changes are reflected below:          Medications that have not changed  Other Medications  acetaminophen-oxyCODONE (Percocet 7.5/325 oral tablet) 2 Tabs Oral (given by mouth) every 6 hours as needed., MAX DAILY DOSE OF ACETAMINOPHEN = 4000 MG  Last Dose:____________________  ALPRAZolam  (ALPRAZolam  0.5 mg oral tablet) 1 Tabs Oral (given by mouth) 3 times a day as needed for anxiety.  Last Dose:____________________  amLODIPine-valsartan (Exforge 10 mg-320 mg oral tablet) 1 Tabs Oral (given by mouth) every day.  Last Dose:____________________  dicyclomine (dicyclomine 10 mg oral capsule) 1 Capsules Oral (given by mouth) 4 times a day.,  THIS MEDICATION IS ASSOCIATED  WITH  AN INCREASED RISK OF FALLS.  Last Dose:____________________  ondansetron (Zofran 4 mg oral tablet) 1 Tabs Oral (given by mouth)  every 4 hours as needed nausea for 3 Days. Refills: 0.  Last Dose:____________________  traMADol (traMADol 50 mg oral tablet) 1 Tabs Oral (given by mouth) every 8 hours as needed as needed for pain.  Last Dose:____________________  No Longer Take the Following Medications  promethazine (Phenergan 12.5 mg rectal suppository) 1 Suppositories Per rectum (in the rectum) every 6 hours as needed nausea for 3 Days. Refills: 0.  Stop Taking Reason: Physician Request      [facility] would like to thank you for allowing us  to assist you with your healthcare needs. The following includes patient education materials and information regarding your injury/illness.  [name] has been given the following list of follow-up instructions, prescriptions, and patient education materials:  Follow-up Instructions:              With: Address: When:   Call the hospitalist service at [phone] with any questions or concerns regarding your recent hospital stay.         With: Address: When:   [facility], [address] [place], [place] [address] Within 1 to 2 weeks   Comments:   The office has been notified of your hospitalization and will contact you for a follow up appointment. If you do not hear from them in 1-2 business days call their office.                  It is important to always keep an active list of medications available so that you can share  with other providers and manage your medications appropriately. As an additional courtesy, we are also providing you with your final active medications list that you can keep with you.           acetaminophen-oxyCODONE (Percocet 7.5/325 oral tablet) 2 Tabs Oral (given by mouth) every 6 hours as needed., MAX DAILY DOSE OF ACETAMINOPHEN = 4000 MG  ALPRAZolam  (ALPRAZolam  0.5 mg oral tablet) 1 Tabs Oral (given by mouth) 3 times a day as needed for anxiety.  amLODIPine-valsartan (Exforge 10 mg-320 mg oral tablet) 1 Tabs Oral (given by mouth) every  day.  dicyclomine (dicyclomine 10 mg oral capsule) 1 Capsules Oral (given by mouth) 4 times a day.,  THIS MEDICATION IS ASSOCIATED  WITH  AN INCREASED RISK OF FALLS.  ondansetron (Zofran 4 mg oral tablet) 1 Tabs Oral (given by mouth) every 4 hours as needed nausea for 3 Days. Refills: 0.  traMADol (traMADol 50 mg oral tablet) 1 Tabs Oral (given by mouth) every 8 hours as needed as needed for pain.      Take only the medications listed above. Contact your doctor prior to taking any medications not on this list.  Discharge instructions, if any, will display below     Instructions for Diet: INSTRUCTIONS FOR DIET%>  Instructions for Supplements: SUPPLEMENT INSTRUCTIONS%>  Instructions for Activity: INSTRUCTIONS FOR ACTIVITY%>  Instructions for Wound Care: INSTRUCTIONS FOR WOUND CARE%>     Medication leaflets, if any, will display below    Patient education materials, if any, will display below        Crohns Disease   Crohns disease is inflammation of the intestinal tract that comes and goes in flare-ups. This is a chronic (long-term) illness. During a flare-up, intense abdominal pain and fever may be felt. Mucus, blood, or pus may appear in the stool. Between flare ups, inflammation lessens and there usually are no symptoms. Crohns disease is a form of inflammatory bowel disease (IBD).    Symptoms of Crohn's disease may include:    Abdominal cramps and pain    Diarrhea, usually bloody, alternating with constipation    Mucus in stools    Rectal bleeding    Rectal pain    Fever    Low energy    Decreased appetite and weight loss   No one knows what exactly causes Crohn's disease, and there is no cure. The goal of treatment is to control and relieve symptoms and prevent complications, so you can lead a full and active life. No single treatment works for everyone, but many things can be done to help.   Diet   Foods did not cause your Crohn's, but they can affect it. Unfortunately, there is no one diet that works  for everyone. Below are some things to try. Keep a food log to figure out what you are sensitive to.    Eat more slowly and smaller amounts at a time, but more often. Remember, you can always eat more, but cannot eat less once you've eaten too much.    High fiber foods are complicated. While they may help constipation they can make the bloating, cramping, gas, and diarrhea worse.    Try avoiding dairy products, sometimes this helps    Try cutting out foods that are high in fat and fatty meats    Eat less sugar    Bloating or passing excess gas may be controlled. Be careful with gassy vegetables and fruits like beans, cabbage, broccoli, and cauliflower.    Be  careful of carbonated beverages and fruit juices. They can make the bloating and diarrhea worse.    Caffeine, alcohol, and stimulants may worsen symptoms. These include coffee, tea, sodas, energy drinks, and chocolate.   Lifestyle   Although stress does not cause Crohn's, it is often a factor in flare-ups. It can also affect how you feel about and cope with your condition.    Look for factors that seem to worsen your symptoms such as stress and emotions.    Counseling can often help relieve stress. So can self-help measures such as exercise, yoga, and meditation.    Depression can be a part of this illness and antidepressants may be prescribed. This may actually help with diarrhea, constipation, and cramping, as well as depression.    Smoking doesn't cause Crohn's, but can make the symptoms worse.   Medicines   Your healthcare provider may prescribe medicines. If so, take them as directed. For acute flare-ups, prescription medicines can be prescribed. Contact your provider if you need this.    Ask your healthcare provider before taking any antidiarrheal medicines.    Avoid anti-inflammatory medicines like ibuprofen or naproxen.    Consider nutritional supplements. This is especially true if the diarrhea is prolonged, or you aren't eating or are  losing weight.   Follow-up care   Follow up with your healthcare provider, or as advised. If a stool sample was taken or cultures were done, you will be told if your treatment needs to change. Call as directed for the results.   Support   Support groups for persons Crohns disease can be a source of useful information on how others are coping with this illness. They are available in person, on the phone, or via the Internet. Contact the following resources for more information.    Crohns and Colitis Foundation of [place], [employer]. [phone] www.K[employer].cz    National Digestive Diseases Information Clearinghouse (NDDIC) (203)381-9273 www.digestive.StageSync.si   When to seek medical advice   Call your healthcare provider right away if any of these occur:    Fever of 100.42F (38C) or higher, or as directed by your healthcare provider    Abdominal pain that doesn't get better when you take the usual measures    Mucus, pus, or blood in the stool (dark or bright red)    Repeated vomiting    Abdominal swelling and pain that doesn't go away after a few hours   Call 911   Call 911 if any of these occur:    Trouble breathing    Confusion    Extreme sleepiness or trouble waking up    Fainting or loss of consciousness    Rapid heart rate      2000-2017 [employer] [address], [place], [place] [address]. All rights reserved. This information is not intended as a substitute for professional medical care. Always follow your healthcare professional's instructions.        IS IT A STROKE?  Act FAST and Check for these signs:    FACE                          Does the face look uneven?    ARM                          Does one arm drift down?    SPEECH  Does their speech sound strange?    TIME                          Call 9-1-1 at any sign of stroke  Heart Attack Signs  Chest discomfort: Most heart attacks involve discomfort in the center of the chest and lasts more than a few minutes, or  goes away and comes back. It can feel like uncomfortable pressure, squeezing, fullness or pain.  Discomfort in upper body: Symptoms can include pain or discomfort in one or both arms, back, neck, jaw or stomach.  Shortness of breath: With or without discomfort.  Other signs: Breaking out in a cold sweat, nausea, or lightheaded.  Remember, MINUTES DO MATTER. If you experience any of these heart attack warning signs, call 9-1-1 to get immediate medical attention!     ---------------------------------------------------------------------------------------------------------------------  [facility] allows you to manage your health, view your test results, and retrieve your discharge documents from your hospital stay securely and conveniently from your computer.  To begin the enrollment process, visit [email or web address] Click on "Sign up now" under [facility].

## 2018-11-03 NOTE — Discharge Summary (Signed)
Inpatient Clinical Summary              [facility]  Post-Acute Care Transfer Instructions  PERSON INFORMATION  Name: [name], [name]  MRN: [id number]    FIN#: [id number]  PHYSICIANS  Admitting Physician: Davonna Belling  Attending Physician: Davonna Belling  PCP: Elmon Else III  Discharge Diagnosis:  1:Enteritis; 2:Obstruction of bowel; 3:Crohn's colitis; HTN (hypertension)  Comment:     PATIENT EDUCATION INFORMATION  Instructions:  CROHN'S DISEASE  Medication Leaflets:    Follow-up:              With: Address: When:   Call the hospitalist service at [phone] with any questions or concerns regarding your recent hospital stay.         With: Address: When:   [facility], [address] [place], [place] [address] Within 1 to 2 weeks   Comments:   The office has been notified of your hospitalization and will contact you for a follow up appointment. If you do not hear from them in 1-2 business days call their office.                MEDICATION LIST  Medication Reconciliation at Discharge:          Medications that have not changed  Other Medications  acetaminophen-oxyCODONE (Percocet 7.5/325 oral tablet) 2 Tabs Oral (given by mouth) every 6 hours as needed., MAX DAILY DOSE OF ACETAMINOPHEN = 4000 MG  Last Dose:____________________  ALPRAZolam (ALPRAZolam 0.5 mg oral tablet) 1 Tabs Oral (given by mouth) 3 times a day as needed for anxiety.  Last Dose:____________________  amLODIPine-valsartan (Exforge 10 mg-320 mg oral tablet) 1 Tabs Oral (given by mouth) every day.  Last Dose:____________________  dicyclomine (dicyclomine 10 mg oral capsule) 1 Capsules Oral (given by mouth) 4 times a day., " THIS MEDICATION IS ASSOCIATED  WITH  AN INCREASED RISK OF FALLS."  Last Dose:____________________  ondansetron (Zofran 4 mg oral tablet) 1 Tabs Oral (given by mouth) every 4 hours as needed nausea for 3 Days. Refills: 0.  Last Dose:____________________  traMADol (traMADol 50  mg oral tablet) 1 Tabs Oral (given by mouth) every 8 hours as needed as needed for pain.  Last Dose:____________________  No Longer Take the Following Medications  promethazine (Phenergan 12.5 mg rectal suppository) 1 Suppositories Per rectum (in the rectum) every 6 hours as needed nausea for 3 Days. Refills: 0.  Stop Taking Reason: Physician Request         Patient's Final Home Medication List Upon Discharge:          acetaminophen-oxyCODONE (Percocet 7.5/325 oral tablet) 2 Tabs Oral (given by mouth) every 6 hours as needed., MAX DAILY DOSE OF ACETAMINOPHEN = 4000 MG  ALPRAZolam (ALPRAZolam 0.5 mg oral tablet) 1 Tabs Oral (given by mouth) 3 times a day as needed for anxiety.  amLODIPine-valsartan (Exforge 10 mg-320 mg oral tablet) 1 Tabs Oral (given by mouth) every day.  dicyclomine (dicyclomine 10 mg oral capsule) 1 Capsules Oral (given by mouth) 4 times a day., " THIS MEDICATION IS ASSOCIATED  WITH  AN INCREASED RISK OF FALLS."  ondansetron (Zofran 4 mg oral tablet) 1 Tabs Oral (given by mouth) every 4 hours as needed nausea for 3 Days. Refills: 0.  traMADol (traMADol 50 mg oral tablet) 1 Tabs Oral (given by mouth) every 8 hours as needed as needed for pain.         Comment:  ORDERS          Order Name Order Details   Discharge Patient 11/03/18 7:31:00 EST, Discharge Home/Self Care

## 2018-11-03 NOTE — Case Communication (Signed)
CM Discharge Planning Assessment - Text       CM Discharge Plan Entered On:  11/03/2018 15:57 EST    Performed On:  11/03/2018 15:56 EST by Marlou Sa, RN, [place] Discharge Plan   Discharge To :   Home independently, Home with family support   CM Progress Note :   11/02/18 TJD 68 y.o. male presented with abd pain, back pain and vomiting.  admitted for active enteritis and bowel obstruction.  NGT placed, npo, ivf, iv morphine.  per md, pt better today and tolerating clears, NGT removed.  per md, plan for dc home tomorrow.  cm met with pt at bedside, he is a/ox 3, states he lives at home with his wife and grandson, I adl's.  he uses a cane, no other dme.  no hh services.  dc plans are to return home with his wife at discharge.  no dc needs identified.  cm dept will cont to follow & assist as needed.  IM signed on 2/4.    11/03/18 TJD pt was deemed medically stable and discharged home with family this am.  no dc needs.       Discharge Planning Assessment Complete :   Yes   Marlou Sa, RN, [place] - 11/03/2018 15:56 EST

## 2018-11-03 NOTE — Nursing Note (Signed)
Nursing Discharge Summary - Text       Nursing Discharge Summary Entered On:  11/03/2018 8:04 EST    Performed On:  11/03/2018 8:02 EST by Ronita Hipps, RN, [place] A               DC Information   Discharge To, Anticipated :   Home with family support   Transportation :   Private vehicle   Accompanied By :   None   Mode of Discharge :   Ambulatory   [place], RN, [place] A - 11/03/2018 8:02 EST   Education   Responsible Learner(s) :   Living Situation: Home independently, Home with family support        Performed by: Richardson Landry - 11/02/2018 10:50  Home Caregiver Name/Relationship: [name]        Performed by: Carolina Cellar, RN, [place] - 11/01/2018 21:00  Home Caregiver Phone Number: [phone]        Performed by: Allyson Sabal E - 11/01/2018 11:58     Home Caregiver Present for Session :   No   Barriers To Learning :   None evident   Teaching Method :   Explanation   [place], RN, [place] A - 11/03/2018 8:02 EST   Post-Hospital Education Adult Grid   Disease Process :   Verbalizes understanding   Pain Management :   Verbalizes understanding   When to Call Health Care Provider :   Verbalizes understanding   [place], RN, [place] A - 11/03/2018 8:02 EST   Health Maintenance Education Adult Grid   Diet/Nutrition :   Verbalizes understanding   [place], RN, [place] A - 11/03/2018 8:02 EST   Medication Education Adult Grid   Med Dosage, Route, Scheduling :   Verbalizes understanding   [place], RN, [place] A - 11/03/2018 8:02 EST   Time Spent Educating Patient :   10 minutes   [place], RN, [place] A - 11/03/2018 8:02 EST

## 2018-11-06 LAB — CULTURE, BLOOD 1

## 2018-11-10 LAB — INFLIXIMAB ACTIVITY AND NEUTRALIZING ANTIBODY
INFLIXIMAB: 31 ug/mL
Infliximab Neutralizing Antibody: 28 ng/mL

## 2019-02-16 LAB — TSH: TSH, 3RD GENERATION: 1.15 mcIU/mL (ref 0.358–3.740)

## 2019-02-16 LAB — CBC WITH AUTO DIFFERENTIAL
Absolute Baso #: 0.1 10*3/uL (ref 0.0–0.2)
Absolute Eos #: 0.3 10*3/uL (ref 0.0–0.5)
Absolute Lymph #: 1.8 10*3/uL (ref 1.0–3.2)
Absolute Mono #: 0.8 10*3/uL (ref 0.3–1.0)
Basophils %: 0.8 % (ref 0.0–2.0)
Eosinophils %: 3.4 % (ref 0.0–7.0)
Hematocrit: 41.5 % (ref 38.0–52.0)
Hemoglobin: 14.2 g/dL (ref 12.0–17.3)
Immature Grans (Abs): 0 10*3/uL
Immature Granulocytes: 0.4 %
Lymphocytes: 25 % (ref 15.0–45.0)
MCH: 30.8 pg (ref 27.0–34.5)
MCHC: 34.2 g/dL (ref 32.0–36.0)
MCV: 90 fL (ref 84.0–100.0)
MPV: 10.8 fL (ref 7.2–13.2)
Monocytes: 10.3 % (ref 4.0–12.0)
NRBC Absolute: 0 10*3/uL
NRBC Automated: 0 %
Neutrophils %: 60.1 % (ref 42.0–74.0)
Neutrophils Absolute: 4.4 10*3/uL (ref 1.6–7.3)
Platelets: 320 10*3/uL (ref 140–440)
RBC: 4.61 x10e6/mcL (ref 4.00–5.20)
RDW: 12.3 % (ref 11.0–16.0)
WBC: 7.3 10*3/uL (ref 3.8–10.6)

## 2019-02-16 LAB — COMPREHENSIVE METABOLIC PANEL
ALT: 20 U/L (ref 0–41)
AST: 22 U/L (ref 0–40)
Albumin/Globulin Ratio: 1.6 mmol/L (ref 1.00–2.00)
Albumin: 4.5 g/dL (ref 3.5–5.2)
Alk Phosphatase: 64 U/L (ref 40–130)
Anion Gap: 12 mmol/L (ref 2–17)
BUN: 10 mg/dL (ref 8–23)
CO2: 27 mmol/L (ref 22–29)
Calcium: 9.9 mg/dL (ref 8.8–10.2)
Chloride: 103 mmol/L (ref 98–107)
Creatinine: 0.7 mg/dL (ref 0.7–1.3)
GFR African American: 113 mL/min/{1.73_m2} (ref >=90)
GFR Non-African American: 98 mL/min/{1.73_m2} (ref >=90)
Globulin: 3 g/dL (ref 1.9–4.4)
Glucose: 90 mg/dL (ref 70–99)
OSMOLALITY CALCULATED: 282 mOsm/kg (ref 270–287)
Potassium: 4.8 mmol/L (ref 3.5–5.3)
Sodium: 142 mmol/L (ref 135–145)
Total Bilirubin: 0.2 mg/dL (ref 0.00–1.20)
Total Protein: 7.4 g/dL (ref 6.4–8.3)

## 2019-02-16 LAB — IRON AND TIBC
Iron Saturation: 23 % (ref 20–40)
Iron: 74 ug/dL (ref 59–158)
TIBC: 326 ug/dL (ref 250–450)
UIBC: 252 ug/dL (ref 112.0–347.0)

## 2019-02-16 LAB — PROTIME-INR
INR: 1 — ABNORMAL LOW (ref 1.5–3.5)
Protime: 13.2 seconds (ref 11.6–14.5)

## 2019-02-16 LAB — SEDIMENTATION RATE: Sed Rate: 22 mm/hr — ABNORMAL HIGH (ref 0–20)

## 2019-02-16 LAB — FERRITIN: Ferritin: 92.9 ng/mL (ref 30.0–400.0)

## 2019-02-16 LAB — PTT: PTT: 35 seconds — ABNORMAL HIGH (ref 23.3–34.5)

## 2019-02-16 LAB — C-REACTIVE PROTEIN: CRP: 0.32 mg/dL (ref 0.00–0.50)

## 2019-03-24 LAB — BUN: BUN: 12 mg/dL (ref 8–23)

## 2019-03-24 LAB — CREATININE
Creatinine: 0.7 mg/dL (ref 0.7–1.3)
GFR African American: 113 mL/min/{1.73_m2} (ref >=90)
GFR Non-African American: 98 mL/min/{1.73_m2} (ref >=90)

## 2019-03-30 NOTE — Nursing Note (Signed)
Adult Admission Assessment - Text       Perioperative Admission Assessment Entered On:  03/30/2019 8:18 EDT    Performed On:  03/30/2019 8:17 EDT by LAKE, RN, JENIFER L               General   Call Complete :   04/03/2019 15:55 EDT   Josephine Cables, RN, Maurice March - 04/03/2019 15:47 EDT     Information Given By :   Self, Spouse, Written documentation   Height/Length Estimated :   185.4 cm(Converted to: 6 ft 1 in, 6.08 ft, 72.99 in)    BMI   Estimated :   101.36 kg(Converted to: 223 lb 7 oz, 223.461 lb)    Body Mass Index Estimated :   29.49 kg/m2   LAKE, RN, JENIFER L - 03/30/2019 8:38 EDT   Call Start :   04/03/2019 15:48 EDT   Josephine Cables RN, Maurice March - 04/03/2019 15:47 EDT     Primary Care Physician/Specialists :   dr snyder-pcp   Emergency Contact Name :   [name]-WIFE   Emergency Contact Phone :   [phone]   Languages :   Deanna Artis, RN, JENIFER L - 03/30/2019 8:17 EDT   Allergies   (As Of: 04/03/2019 15:57:59 EDT)   Allergies (Active)   Alka-Seltzer Plus Cold  Estimated Onset Date:   Unspecified ; Reactions:   Itching ; Created By:   LAKE, RN, JENIFER L; Reaction Status:   Active ; Category:   Drug ; Substance:   Alka-Seltzer Plus Cold ; Type:   Allergy ; Severity:   Mild ; Updated By:   Leona Singleton, RN, JENIFER L; Reviewed Date:   04/03/2019 15:55 EDT        Medication History   Medication List   (As Of: 04/03/2019 15:57:59 EDT)   Home Meds    loratadine  :   loratadine ; Status:   Documented ; Ordered As Mnemonic:   Claritin 10 mg oral tablet ; Simple Display Line:   10 mg, 1 tabs, Oral, Daily, for 14 days, 14 tabs, 0 Refill(s) ; Catalog Code:   loratadine ; Order Dt/Tm:   04/03/2019 15:50:15 EDT          cholestyramine  :   cholestyramine ; Status:   Documented ; Ordered As Mnemonic:   Prevalite 4 g/5.5 g oral powder for reconstitution ; Simple Display Line:   4 g, Oral, BID, 60 EA, 0 Refill(s) ; Ordering Provider:   HADZIJAHIC-MD,  Illene Regulus; Catalog Code:   cholestyramine ; Order Dt/Tm:   03/30/2019 08:47:54 EDT          ustekinumab  :    ustekinumab ; Status:   Documented ; Ordered As Mnemonic:   Stelara PFS 45 mg/0.5 mL subcutaneous solution ; Simple Display Line:   mg, Subcutaneous, 0 Refill(s) ; Catalog Code:   ustekinumab ; Order Dt/Tm:   03/30/2019 08:45:45 EDT ; Comment:   Outpatient use only          omeprazole  :   omeprazole ; Status:   Documented ; Ordered As Mnemonic:   omeprazole 40 mg oral delayed release capsule ; Simple Display Line:   40 mg, 1 caps, Oral, Daily, 0 Refill(s) ; Catalog Code:   omeprazole ; Order Dt/Tm:   03/30/2019 08:44:10 EDT          ondansetron  :   ondansetron ; Status:   Documented ; Ordered As Mnemonic:   ondansetron 4 mg oral tablet ;  Simple Display Line:   4 mg, 1 tabs, Oral, QID, 0 Refill(s) ; Catalog Code:   ondansetron ; Order Dt/Tm:   03/30/2019 08:44:10 EDT          amLODIPine  :   amLODIPine ; Status:   Documented ; Ordered As Mnemonic:   amLODIPine 10 mg oral tablet ; Simple Display Line:   10 mg, 1 tabs, Oral, Daily, 0 Refill(s) ; Catalog Code:   amLODIPine ; Order Dt/Tm:   03/30/2019 08:44:10 EDT          hydrochlorothiazide  :   hydrochlorothiazide ; Status:   Documented ; Ordered As Mnemonic:   hydrochlorothiazide 12.5 mg oral tablet ; Simple Display Line:   12.5 mg, 1 tabs, Oral, Daily, 0 Refill(s) ; Catalog Code:   hydrochlorothiazide ; Order Dt/Tm:   03/30/2019 08:44:10 EDT          metoprolol  :   metoprolol ; Status:   Documented ; Ordered As Mnemonic:   Metoprolol Succinate ER 50 mg oral tablet, extended release ; Simple Display Line:   50 mg, 1 tabs, Oral, Daily, 30 tabs, 0 Refill(s) ; Catalog Code:   metoprolol ; Order Dt/Tm:   03/30/2019 08:44:10 EDT          olmesartan  :   olmesartan ; Status:   Documented ; Ordered As Mnemonic:   olmesartan 40 mg oral tablet ; Simple Display Line:   40 mg, 1 tabs, Oral, Daily, 30 tabs, 0 Refill(s) ; Catalog Code:   olmesartan ; Order Dt/Tm:   03/30/2019 08:44:10 EDT          acetaminophen-oxyCODONE  :   acetaminophen-oxyCODONE ; Status:   Documented ; Ordered As  Mnemonic:   Percocet 7.5/325 oral tablet ; Simple Display Line:   2 tabs, Oral, q6hr, PRN: as needed for pain, 0 Refill(s) ; Catalog Code:   acetaminophen-oxyCODONE ; Order Dt/Tm:   11/01/2018 12:03:11 EST ; Comment:   MAX DAILY DOSE OF ACETAMINOPHEN = 4000 MG          ALPRAZolam  :   ALPRAZolam ; Status:   Documented ; Ordered As Mnemonic:   ALPRAZolam 0.5 mg oral tablet ; Simple Display Line:   0.5 mg, 1 tabs, Oral, TID, PRN: for anxiety, 0 Refill(s) ; Catalog Code:   ALPRAZolam ; Order Dt/Tm:   11/01/2018 12:02:29 EST          dicyclomine  :   dicyclomine ; Status:   Documented ; Ordered As Mnemonic:   dicyclomine 10 mg oral capsule ; Simple Display Line:   10 mg, 1 caps, Oral, QID, PRN: diarrhea, 40 caps, 0 Refill(s) ; Catalog Code:   dicyclomine ; Order Dt/Tm:   11/01/2018 12:03:38 EST ; Comment:   " THIS MEDICATION IS ASSOCIATED   WITH   AN INCREASED RISK OF FALLS."            Problem History   (As Of: 04/03/2019 15:57:59 EDT)   Problems(Active)    Arthritis (SNOMED CT  :1610960 )  Name of Problem:   Arthritis ; Recorder:   Katrinka Blazing, RN, Maylon Peppers; Confirmation:   Confirmed ; Classification:   Patient Stated ; Code:   4540981 ; Contributor System:   PowerChart ; Last Updated:   11/01/2018 12:10 EST ; Life Cycle Date:   11/01/2018 ; Life Cycle Status:   Active ; Vocabulary:   SNOMED CT        COPD, 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 )  Name of Problem:   COPD, mild ; Recorder:   LAKE, RN, JENIFER L; Confirmation:   Confirmed ; Classification:   Patient Stated ; Code:   [id number] ; Contributor System:   PowerChart ; Last Updated:   03/30/2019 8:30 EDT ; Life Cycle Status:   Active ; Vocabulary:   SNOMED CT   ; Comments:        03/30/2019 8:30 - LAKE, RN, JENIFER L  NO MEDS      Crohn disease (SNOMED CT  :42595638 )  Name of Problem:   Crohn disease ; Recorder:   SMITH, RN, Maylon Peppers; Confirmation:   Confirmed ; Classification:   Patient Stated ; Code:   75643329 ; Contributor System:   PowerChart ; Last Updated:   11/01/2018 12:05 EST ;  Life Cycle Date:   11/01/2018 ; Life Cycle Status:   Active ; Vocabulary:   SNOMED CT        Difficulty voiding (SNOMED CT  :518841660 )  Name of Problem:   Difficulty voiding ; Recorder:   LAKE, RN, JENIFER L; Confirmation:   Confirmed ; Classification:   Patient Stated ; Code:   [id number] ; Contributor System:   PowerChart ; Last Updated:   03/30/2019 8:31 EDT ; Life Cycle Date:   03/30/2019 ; Life Cycle Status:   Active ; Vocabulary:   SNOMED CT        Hypertension (SNOMED CT  :3235573220 )  Name of Problem:   Hypertension ; Recorder:   Katrinka Blazing, RN, Maylon Peppers; Confirmation:   Confirmed ; Classification:   Patient Stated ; Code:   [id number] ; Contributor System:   Dietitian ; Last Updated:   11/01/2018 12:05 EST ; Life Cycle Date:   11/01/2018 ; Life Cycle Status:   Active ; Vocabulary:   SNOMED CT          Diagnoses(Active)    Encounter for screening for other viral diseases  Date:   03/30/2019 ; Confirmation:   Confirmed ; Clinical Dx:   Encounter for screening for other viral diseases ; Classification:   Medical ; Clinical Service:   Non-Specified ; Code:   ICD-10-CM ; Probability:   0 ; Diagnosis Code:   Z11.59      Encounter for therapeutic drug level monitoring  Date:   03/30/2019 ; Confirmation:   Confirmed ; Clinical Dx:   Encounter for therapeutic drug level monitoring ; Classification:   Medical ; Clinical Service:   Non-Specified ; Code:   ICD-10-CM ; Probability:   0 ; Diagnosis Code:   Z51.81      Other long term (current) drug therapy  Date:   03/30/2019 ; Confirmation:   Confirmed ; Clinical Dx:   Other long term (current) drug therapy ; Classification:   Medical ; Clinical Service:   Non-Specified ; Code:   ICD-10-CM ; Probability:   0 ; Diagnosis Code:   S28.315        Procedure History        -    Procedure History   (As Of: 04/03/2019 15:57:59 EDT)     Anesthesia Minutes:   0 ; Procedure Name:   Laparoscopic cholecystectomy ; Procedure Minutes:   0 ; Last Reviewed Dt/Tm:   04/03/2019 15:57:52 EDT             Procedure Dt/Tm:   1761 ; Anesthesia Minutes:   0 ; Procedure Name:   PARTIAL COLECTOMY ; Procedure Minutes:   0 ; Last Reviewed Dt/Tm:  04/03/2019 15:57:52 EDT            Anesthesia/Sedation   Anesthesia History :   Prior general anesthesia   SN - Malignant Hyperthermia :   Denies   Previous Problem with Anesthesia :   None   Moderate Sedation History :   Prior sedation for procedure   Previous Problem With Sedation :   None   Symptoms of Sleep Apnea :   Age greater than 50, Hypertension, Male Gender, No OSA Symptoms   Symptoms of Sleep Apnea Score :   3    Shortness of Breath Indicator :   No shortness of breath   Pregnancy Status :   N/A   LAKE, RN, JENIFER L - 03/30/2019 8:19 EDT   Bloodless Medicine   Is Blood Transfusion Acceptable to Patient :   Yes   LAKE, RN, JENIFER L - 03/30/2019 8:19 EDT   ID Risk Screen Symptoms   Recent Travel History :   No recent travel   Close Contact with COVID-19 ID :   Preadmission testing patients only   Last 14 days COVID-19 ID :   No   TB Symptom Screen :   No symptoms   C. diff Symptom/History ID :   Neither of the above   LAKE, RN, JENIFER L - 03/30/2019 8:19 EDT   ID COVID-19 Screen   Fever OR Chills :   No   Headache :   No   New or Worsening Cough :   No   Fatigue :   No   Shortness of Breath ID :   No   Myalgia (Muscle Pain) :   No   Dyspnea :   No   Diarrhea :   No   Sore Throat :   No   Nausea :   No   Laryngitis :   No   Sudden Loss of Taste or Smell :   No   LAKE, RN, JENIFER L - 03/30/2019 8:19 EDT   Social History   Social History   (As Of: 04/03/2019 15:57:59 EDT)   Tobacco:        Tobacco use: Former smoker, quit more than 30 days ago.  20 year(s).   (Last Updated: 03/30/2019 08:41:07 EDT by LAKE, RN, JENIFER L)          Alcohol:        Denies   (Last Updated: 11/01/2018 08:37:48 EST by Fayrene Fearing, RN, Trula Ore)          Substance Use:        Current, Marijuana, Daily   Comments:  11/01/2018 8:38 - Fayrene Fearing, RN, Christina: last use per pt 10/31/18   (Last Updated: 11/01/2018 12:09:01 EST by  Katrinka Blazing, RN, Maylon Peppers)            Advance Directive   Advance Directive :   No   LAKE, RN, JENIFER L - 03/30/2019 8:19 EDT   PAT Patient Instructions   Patient Arrival Time PAT :   04/10/2019 6:00 EDT   Medications in AM :   AMLODIPINE, METOPROLOL, =XANAX, ZOFRAN IF NEEDED  OMEPRAZOLE   Ola Spurr - 04/03/2019 15:47 EDT     Name of Contact PAT :   [name]   Relationship of Contact PAT :   WIFE   Contact Number PAT :   [phone]   LAKE, RN, JENIFER L - 03/30/2019 8:38 EDT   Additional Contacts PAT :   COVID 03/31/2019;  7/4 @ 0936 pt states he did not get COVID test on 7/3, message sent to Dr Wynelle Beckmann; Urgent, so must proceed; Anesthesia and Peri-op teams notified; HF 04/03/2019 rescreened. no symptoms  07/06 INSTRUCTED TO GET COVID 04/06/2019 [facility]   Holst, RN, Winston R - 04/03/2019 15:47 EDT             Medication Understanding :   Trenton Gammon understanding   NPO PAT :   ERAS Protocol Per MD order   LAKE, RN, JENIFER L - 03/30/2019 8:19 EDT   PAT Instructions Grid   Burton Apley Understanding :   Verbalizes understanding   Perfume Understanding :   Verbalizes understanding   Valuables Understanding :   Verbalizes understanding   Smoking Understanding :   Verbalizes understanding   Clothing Understanding :   Financial trader MD for Illness :   Financial trader MD for skin injury :   Verbalizes understanding   LAKE, RN, JENIFER L - 03/30/2019 8:19 EDT   Service Line PAT :   Gen Surg   Laterality PAT :   N/A   Prep PAT :   Hibiclens   Transportation Instructions PAT :   Accompany to Hospital, Transport Home, Remain with 24 hours post-procedure   LAKE, RN, JENIFER L - 03/30/2019 8:19 EDT

## 2019-03-31 LAB — POTASSIUM: Potassium: 4.4 mmol/L (ref 3.5–5.3)

## 2019-04-09 LAB — COVID-19: SARS-CoV-2: NOT DETECTED

## 2019-04-09 NOTE — Nursing Note (Signed)
Adult Admission Assessment - Text       Perioperative Admission Assessment Entered On:  04/09/2019 15:59 EDT    Performed On:  04/09/2019 15:58 EDT by HAUFF, RN, LISA               General   Call Start :   04/03/2019 15:48 EDT   Call Complete :   04/03/2019 15:55 EDT   Information Given By :   Self, Spouse, Written documentation   Height/Length Estimated :   185.4 cm(Converted to: 6 ft 1 in, 6.08 ft, 72.99 in)    BMI   Estimated :   101.36 kg(Converted to: 223 lb 7 oz, 223.461 lb)    Body Mass Index Estimated :   29.49 kg/m2   Primary Care Physician/Specialists :   dr snyder-pcp   Emergency Contact Name :   [name]-WIFE   Emergency Contact Phone :   [phone]   Languages :   Tressia Danas, RN, LISA - 04/09/2019 15:58 EDT   PAT Patient Instructions   Patient Arrival Time PAT :   04/10/2019 6:00 EDT   Medications in AM :   AMLODIPINE, METOPROLOL, =XANAX, ZOFRAN IF NEEDED  OMEPRAZOLE   Medication Understanding :   Verbalizes understanding   NPO PAT :   ERAS Protocol Per MD order   HAUFF, RN, LISA - 04/09/2019 15:58 EDT   PAT Instructions Grid   Wynonia Lawman Understanding :   Verbalizes understanding   Perfume Understanding :   Verbalizes understanding   Valuables Understanding :   Verbalizes understanding   Smoking Understanding :   Verbalizes understanding   Clothing Understanding :   Verbalizes understanding   Contact MD for Illness :   Verbalizes understanding   Contact MD for skin injury :   Verbalizes understanding   HAUFF, RN, LISA - 04/09/2019 15:58 EDT   Service Line PAT :   Gen Surg   Laterality PAT :   N/A   Prep PAT :   Hibiclens   Name of Contact PAT :   [name]   Relationship of Contact PAT :   WIFE   Contact Number PAT :   [phone]   Transportation Instructions PAT :   Accompany to [place] with 24 hours post-procedure   Additional Contacts PAT :   COVID 03/31/2019;   7/4 @ 0936 pt states he did not get COVID test on 7/3, message sent to Dr Colonel Bald; Urgent, so must proceed; Anesthesia  and Peri-op teams notified; HF 04/03/2019 rescreened. no symptoms    07/06 INSTRUCTED TO GET COVID 04/06/2019 [facility]   Additional Comments PAT :   07/12  neg covid prescreen, visitor policy rev. lhrn   HAUFF, RN, LISA - 04/09/2019 15:58 EDT

## 2019-04-09 NOTE — Nursing Note (Signed)
Adult Admission Assessment - Text       Perioperative Admission Assessment Entered On:  04/09/2019 16:03 EDT    Performed On:  04/09/2019 16:02 EDT by HAUFF, RN, LISA               General   Call Start :   04/03/2019 15:48 EDT   Call Complete :   04/03/2019 15:55 EDT   Information Given By :   Self, Spouse, Written documentation   Height/Length Estimated :   185.4 cm(Converted to: 6 ft 1 in, 6.08 ft, 72.99 in)    BMI   Estimated :   101.36 kg(Converted to: 223 lb 7 oz, 223.461 lb)    Body Mass Index Estimated :   29.49 kg/m2   Primary Care Physician/Specialists :   dr snyder-pcp   Emergency Contact Name :   [name]-WIFE   Emergency Contact Phone :   [phone]   Languages :   Binnie Kand, RN, LISA - 04/09/2019 16:02 EDT   Allergies   (As Of: 04/10/2019 06:49:20 EDT)   Allergies (Active)   Alka-Seltzer Plus Cold  Estimated Onset Date:   Unspecified ; Reactions:   Itching ; Created By:   LAKE, RN, JENIFER L; Reaction Status:   Active ; Category:   Drug ; Substance:   Alka-Seltzer Plus Cold ; Type:   Allergy ; Severity:   Mild ; Updated By:   Leona Singleton, RN, JENIFER L; Reviewed Date:   04/10/2019 6:41 EDT      Metoprolol Succinate ER  Estimated Onset Date:   Unspecified ; Reactions:   Itching, rash ; Comments:     Comment 1: Pt states he "itches on the inside."     ; Created By:   Ruthann Cancer; Reaction Status:   Active ; Category:   Drug ; Substance:   Metoprolol Succinate ER ; Type:   Allergy ; Severity:   Severe ; Updated By:   Ruthann Cancer; Reviewed Date:   04/10/2019 6:41 EDT        Medication History   Medication List   (As Of: 04/10/2019 06:52:45 EDT)   Normal Order    Lactated Ringers Injection solution 1,000 mL  :   Lactated Ringers Injection solution 1,000 mL ; Status:   Ordered ; Ordered As Mnemonic:   Lactated Ringers Injection 1,000 mL ; Simple Display Line:   10 mL/hr, IV ; Ordering Provider:   LAGARES-GARCIA-MD,  Donald Pore; Catalog Code:   Lactated Ringers Injection ; Order Dt/Tm:   04/10/2019 06:26:11  EDT ; Comment:   Perioperative use ONLY  For Non Dialysis Patient          sodium chloride 0.9% Inj Soln 10 mL syringe  :   sodium chloride 0.9% Inj Soln 10 mL syringe ; Status:   Ordered ; Ordered As Mnemonic:   sodium chloride 0.9% flush syringe range dose ; Simple Display Line:   30 mL, IV Push, q8hr ; Ordering Provider:   LAGARES-GARCIA-MD,  JORGE; Catalog Code:   sodium chloride flush ; Order Dt/Tm:   04/10/2019 29:56:21 EDT          alvimopan 12 mg Cap  :   alvimopan 12 mg Cap ; Status:   Ordered ; Ordered As Mnemonic:   alvimopan ; Simple Display Line:   12 mg, 1 caps, Oral, On Call ; Ordering Provider:   Suanne Marker; Catalog Code:   alvimopan ; Order Dt/Tm:   04/10/2019 06:26:11 EDT  ertapenem 1gm/130ml NS ADM + Premix ADM sodium chloride inj...Marland KitchenMarland KitchenMarland Kitchen 100 mL  :   ertapenem 1gm/167ml NS ADM + Premix ADM sodium chloride inj...Marland KitchenMarland KitchenMarland Kitchen 100 mL ; Status:   Ordered ; Ordered As Mnemonic:   ertapenem + Sodium Chloride 0.9% 100 mL ; Simple Display Line:   1 g, 100 mL, 200 mL/hr, IV Piggyback, Once ; Ordering Provider:   LAGARES-GARCIA-MD,  Donald Pore; Catalog Code:   ertapenem ; Order Dt/Tm:   04/10/2019 06:26:11 EDT          gabapentin 100 mg Cap  :   gabapentin 100 mg Cap ; Status:   Ordered ; Ordered As Mnemonic:   Neurontin ; Simple Display Line:   100 mg, 1 caps, Oral, On Call ; Ordering Provider:   LAGARES-GARCIA-MD,  Donald Pore; Catalog Code:   gabapentin ; Order Dt/Tm:   04/10/2019 17:51:02 EDT ; Comment:   Use caution in patient > 89 years old          heparin 5000 units/mL Inj Soln 1 mL  :   heparin 5000 units/mL Inj Soln 1 mL ; Status:   Ordered ; Ordered As Mnemonic:   heparin ; Simple Display Line:   5,000 units, 1 mL, Subcutaneous, On Call ; Ordering Provider:   Suanne Marker; Catalog Code:   heparin ; Order Dt/Tm:   04/10/2019 06:26:11 EDT          A Patient Specific Medication  :   A Patient Specific Medication ; Status:   Ordered ; Ordered As Mnemonic:   A Patient Specific Medication ;  Simple Display Line:   1 EA, Kit-Combo, q35min, PRN: other (see comment) ; Ordering Provider:   Suanne Marker; Catalog Code:   A Patient Specific Medication ; Order Dt/Tm:   04/10/2019 58:52:77 EDT          A Patient Specific Refrigerated Medication  :   A Patient Specific Refrigerated Medication ; Status:   Ordered ; Ordered As Mnemonic:   A Patient Specific Refrigerated Medication ; Simple Display Line:   1 EA, Kit-Combo, q75min, PRN: other (see comment) ; Ordering Provider:   Suanne Marker; Catalog Code:   A Patient Specific Refrigerated Medicati ; Order Dt/Tm:   04/10/2019 82:42:35 EDT ; Comment:   to access the patient specific Refrigerated medications          Delivery and Return [place] Access  :   Delivery and Return [place] Access ; Status:   Ordered ; Ordered As Mnemonic:   Delivery and Return Bin Access ; Simple Display Line:   1 EA, Kit-Combo, q64min, PRN: other (see comment) ; Ordering Provider:   Suanne Marker; Catalog Code:   Delivery and Return Bin Access ; Order Dt/Tm:   04/10/2019 36:14:43 EDT ; Comment:   This code grants access to the [employer] for the Delivery and Return Bin Access          lidocaine 1% PF Inj Soln 2 mL  :   lidocaine 1% PF Inj Soln 2 mL ; Status:   Ordered ; Ordered As Mnemonic:   lidocaine 1% preservative-free injectable solution ; Simple Display Line:   0.25 mL, ID, q27min, PRN: other (see comment) ; Ordering Provider:   Suanne Marker; Catalog Code:   lidocaine ; Order Dt/Tm:   04/10/2019 15:40:08 EDT ; Comment:   to access lidocaine 1%  2 mL vial for IV start and Life Port access  lidocaine 2% Topical Gel with applicator 10-11 mL  :   lidocaine 2% Topical Gel with applicator 10-11 mL ; Status:   Ordered ; Ordered As Mnemonic:   Uro-Jet 2% topical gel with applicator ; Simple Display Line:   1 app, Topical, q48min, PRN: other (see comment) ; Ordering Provider:   Suanne Marker; Catalog Code:   lidocaine  topical ; Order Dt/Tm:   04/10/2019 72:53:66 EDT          Respiratory MDI Treatment  :   Respiratory MDI Treatment ; Status:   Ordered ; Ordered As Mnemonic:   Respiratory MDI Treatment ; Simple Display Line:   1 EA, Kit-Combo, q17min, PRN: other (see comment) ; Ordering Provider:   Suanne Marker; Catalog Code:   Respiratory MDI Treatment ; Order Dt/Tm:   04/10/2019 44:03:47 EDT          sodium chloride 0.9% Inj Soln 10 mL syringe  :   sodium chloride 0.9% Inj Soln 10 mL syringe ; Status:   Ordered ; Ordered As Mnemonic:   sodium chloride 0.9% flush syringe range dose ; Simple Display Line:   30 mL, IV Push, q65min, PRN: other (see comment) ; Ordering Provider:   Suanne Marker; Catalog Code:   sodium chloride flush ; Order Dt/Tm:   04/10/2019 42:59:56 EDT          sodium chloride 0.9% Inj Soln 10 mL vial PF  :   sodium chloride 0.9% Inj Soln 10 mL vial PF ; Status:   Ordered ; Ordered As Mnemonic:   sodium chloride 0.9% vial for reconstitution range dose ; Simple Display Line:   30 mL, IV Push, q44min, PRN: other (see comment) ; Ordering Provider:   Suanne Marker; Catalog Code:   sodium chloride flush ; Order Dt/Tm:   04/10/2019 38:75:64 EDT ; Comment:   for access to sodium chloride 0.9% vial when needed as a diluent for reconstitutable medications          sterile water Inj Soln 10 mL  :   sterile water Inj Soln 10 mL ; Status:   Ordered ; Ordered As Mnemonic:   sterile water for reconstitution ; Simple Display Line:   10 mL, N/A, q32min, PRN: other (see comment) ; Ordering Provider:   Suanne Marker; Catalog Code:   sterile water for reconstitution ; Order Dt/Tm:   04/10/2019 33:29:51 EDT ; Comment:   Access sterile water when needed as a diluent for reconstitutable medications. Not for IV use.            Home Meds    metroNIDAZOLE  :   metroNIDAZOLE ; Status:   Documented ; Ordered As Mnemonic:   metroNIDAZOLE 500 mg oral tablet ; Simple Display Line:   500 mg, 1 tabs, Oral,  q12hr, 20 tabs, 0 Refill(s) ; Catalog Code:   metroNIDAZOLE ; Order Dt/Tm:   04/10/2019 06:52:36 EDT          neomycin  :   neomycin ; Status:   Documented ; Ordered As Mnemonic:   neomycin 500 mg oral tablet ; Simple Display Line:   500 mg, 1 tabs, 0 Refill(s) ; Catalog Code:   neomycin ; Order Dt/Tm:   04/10/2019 06:52:36 EDT          multivitamin  :   multivitamin ; Status:   Documented ; Ordered As Mnemonic:   One-A-Day 50+ ; Simple Display Line:   Oral, Daily, 0 Refill(s) ; Catalog Code:  multivitamin ; Order Dt/Tm:   04/10/2019 06:45:44 EDT          loratadine  :   loratadine ; Status:   Documented ; Ordered As Mnemonic:   Claritin 10 mg oral tablet ; Simple Display Line:   10 mg, 1 tabs, Oral, Daily, for 14 days, 14 tabs, 0 Refill(s) ; Catalog Code:   loratadine ; Order Dt/Tm:   04/03/2019 15:50:15 EDT          cholestyramine  :   cholestyramine ; Status:   Documented ; Ordered As Mnemonic:   Prevalite 4 g/5.5 g oral powder for reconstitution ; Simple Display Line:   4 g, Oral, BID, 60 EA, 0 Refill(s) ; Ordering Provider:   HADZIJAHIC-MD,  Illene Regulus; Catalog Code:   cholestyramine ; Order Dt/Tm:   03/30/2019 08:47:54 EDT          ustekinumab  :   ustekinumab ; Status:   Documented ; Ordered As Mnemonic:   Stelara PFS 45 mg/0.5 mL subcutaneous solution ; Simple Display Line:   mg, Subcutaneous, 0 Refill(s) ; Catalog Code:   ustekinumab ; Order Dt/Tm:   03/30/2019 08:45:45 EDT ; Comment:   Outpatient use only          omeprazole  :   omeprazole ; Status:   Documented ; Ordered As Mnemonic:   omeprazole 40 mg oral delayed release capsule ; Simple Display Line:   40 mg, 1 caps, Oral, Daily, 0 Refill(s) ; Catalog Code:   omeprazole ; Order Dt/Tm:   03/30/2019 08:44:10 EDT          ondansetron  :   ondansetron ; Status:   Documented ; Ordered As Mnemonic:   ondansetron 4 mg oral tablet ; Simple Display Line:   4 mg, 1 tabs, Oral, QID, 0 Refill(s) ; Catalog Code:   ondansetron ; Order Dt/Tm:   03/30/2019 08:44:10 EDT           amLODIPine  :   amLODIPine ; Status:   Documented ; Ordered As Mnemonic:   amLODIPine 10 mg oral tablet ; Simple Display Line:   10 mg, 1 tabs, Oral, Daily, 0 Refill(s) ; Catalog Code:   amLODIPine ; Order Dt/Tm:   03/30/2019 08:44:10 EDT          hydrochlorothiazide  :   hydrochlorothiazide ; Status:   Documented ; Ordered As Mnemonic:   hydrochlorothiazide 12.5 mg oral tablet ; Simple Display Line:   12.5 mg, 1 tabs, Oral, Daily, 0 Refill(s) ; Catalog Code:   hydrochlorothiazide ; Order Dt/Tm:   03/30/2019 08:44:10 EDT          metoprolol  :   metoprolol ; Status:   Documented ; Ordered As Mnemonic:   Metoprolol Succinate ER 50 mg oral tablet, extended release ; Simple Display Line:   50 mg, 1 tabs, Oral, Daily, 30 tabs, 0 Refill(s) ; Catalog Code:   metoprolol ; Order Dt/Tm:   03/30/2019 08:44:10 EDT          olmesartan  :   olmesartan ; Status:   Documented ; Ordered As Mnemonic:   olmesartan 40 mg oral tablet ; Simple Display Line:   40 mg, 1 tabs, Oral, Daily, 30 tabs, 0 Refill(s) ; Catalog Code:   olmesartan ; Order Dt/Tm:   03/30/2019 08:44:10 EDT          acetaminophen-oxyCODONE  :   acetaminophen-oxyCODONE ; Status:   Documented ; Ordered As Mnemonic:   Percocet 7.5/325 oral  tablet ; Simple Display Line:   2 tabs, Oral, q6hr, PRN: as needed for pain, 0 Refill(s) ; Catalog Code:   acetaminophen-oxyCODONE ; Order Dt/Tm:   11/01/2018 12:03:11 EST ; Comment:   MAX DAILY DOSE OF ACETAMINOPHEN = 4000 MG          ALPRAZolam  :   ALPRAZolam ; Status:   Documented ; Ordered As Mnemonic:   ALPRAZolam 0.5 mg oral tablet ; Simple Display Line:   0.5 mg, 1 tabs, Oral, TID, PRN: for anxiety, 0 Refill(s) ; Catalog Code:   ALPRAZolam ; Order Dt/Tm:   11/01/2018 12:02:29 EST          dicyclomine  :   dicyclomine ; Status:   Documented ; Ordered As Mnemonic:   dicyclomine 10 mg oral capsule ; Simple Display Line:   10 mg, 1 caps, Oral, QID, PRN: diarrhea, 40 caps, 0 Refill(s) ; Catalog Code:   dicyclomine ; Order Dt/Tm:   11/01/2018  12:03:38 EST ; Comment:   " THIS MEDICATION IS ASSOCIATED   WITH   AN INCREASED RISK OF FALLS."            Problem History   (As Of: 04/10/2019 06:49:20 EDT)   Problems(Active)    Arthritis (SNOMED CT  :7253664 )  Name of Problem:   Arthritis ; Recorder:   Katrinka Blazing, RN, Maylon Peppers; Confirmation:   Confirmed ; Classification:   Patient Stated ; Code:   4034742 ; Contributor System:   PowerChart ; Last Updated:   11/01/2018 12:10 EST ; Life Cycle Date:   11/01/2018 ; Life Cycle Status:   Active ; Vocabulary:   SNOMED CT        COPD, 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 )  Name of Problem:   COPD, mild ; Recorder:   LAKE, RN, JENIFER L; Confirmation:   Confirmed ; Classification:   Patient Stated ; Code:   433295188 ; Contributor System:   PowerChart ; Last Updated:   03/30/2019 8:30 EDT ; Life Cycle Status:   Active ; Vocabulary:   SNOMED CT   ; Comments:        03/30/2019 8:30 - LAKE, RN, JENIFER L  NO MEDS      Crohn disease (SNOMED CT  :41660630 )  Name of Problem:   Crohn disease ; Recorder:   SMITH, RN, Maylon Peppers; Confirmation:   Confirmed ; Classification:   Patient Stated ; Code:   [id number] ; Contributor System:   PowerChart ; Last Updated:   11/01/2018 12:05 EST ; Life Cycle Date:   11/01/2018 ; Life Cycle Status:   Active ; Vocabulary:   SNOMED CT        Difficulty voiding (SNOMED CT  :355732202 )  Name of Problem:   Difficulty voiding ; Recorder:   LAKE, RN, JENIFER L; Confirmation:   Confirmed ; Classification:   Patient Stated ; Code:   542706237 ; Contributor System:   PowerChart ; Last Updated:   03/30/2019 8:31 EDT ; Life Cycle Date:   03/30/2019 ; Life Cycle Status:   Active ; Vocabulary:   SNOMED CT        Hypertension (SNOMED CT  :6283151761 )  Name of Problem:   Hypertension ; Recorder:   Katrinka Blazing, RN, Maylon Peppers; Confirmation:   Confirmed ; Classification:   Patient Stated ; Code:   [id number] ; Contributor System:   Dietitian ; Last Updated:   11/01/2018 12:05 EST ; Life Cycle Date:   11/01/2018 ; Life  Cycle Status:   Active ;  Vocabulary:   SNOMED CT          Diagnoses(Active)    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 ; Confirmation:   Confirmed ; Clinical Dx:   Colonic stricture ; Classification:   Medical ; Clinical Service:   Non-Specified ; Code:   ICD-10-CM ; Probability:   0 ; Diagnosis Code:   T73.220      Encounter for screening for other viral diseases  Date:   03/30/2019 ; Confirmation:   Confirmed ; Clinical Dx:   Encounter for screening for other viral diseases ; Classification:   Medical ; Clinical Service:   Non-Specified ; Code:   ICD-10-CM ; Probability:   0 ; Diagnosis Code:   Z11.59      Encounter for therapeutic drug level monitoring  Date:   03/30/2019 ; Confirmation:   Confirmed ; Clinical Dx:   Encounter for therapeutic drug level monitoring ; Classification:   Medical ; Clinical Service:   Non-Specified ; Code:   ICD-10-CM ; Probability:   0 ; Diagnosis Code:   Z51.81      Other long term (current) drug therapy  Date:   03/30/2019 ; Confirmation:   Confirmed ; Clinical Dx:   Other long term (current) drug therapy ; Classification:   Medical ; Clinical Service:   Non-Specified ; Code:   ICD-10-CM ; Probability:   0 ; Diagnosis Code:   U54.270        Procedure History        -    Procedure History   (As Of: 04/10/2019 06:49:20 EDT)     Anesthesia Minutes:   0 ; Procedure Name:   Laparoscopic cholecystectomy ; Procedure Minutes:   0 ; Last Reviewed Dt/Tm:   04/10/2019 06:46:38 EDT            Procedure Dt/Tm:   6237 ; Anesthesia Minutes:   0 ; Procedure Name:   PARTIAL COLECTOMY ; Procedure Minutes:   0 ; Last Reviewed Dt/Tm:   04/10/2019 06:46:38 EDT            Anesthesia Minutes:   0 ; Procedure Name:   Colonoscopy ; Procedure Minutes:   0 ; Last Reviewed Dt/Tm:   04/10/2019 06:46:57 EDT            History Confirmation   Problem History Changes PAT :   No   Procedure History Changes PAT :   No   Arnett, RN, Christina - 04/10/2019 6:39 EDT   Anesthesia/Sedation   Anesthesia History :   Prior general anesthesia   SN - Malignant  Hyperthermia :   Denies   Previous Problem with Anesthesia :   PostOp nausea and vomitting   Moderate Sedation History :   Prior sedation for procedure   Previous Problem With Sedation :   Nausea   Symptoms of Sleep Apnea :   Age greater than 50, Hypertension, Male Gender   Symptoms of Sleep Apnea Score :   3    Shortness of Breath Indicator :   No shortness of breath   Jason Coop, RN, Trula Ore - 04/10/2019 6:39 EDT   Bloodless Medicine   Is Blood Transfusion Acceptable to Patient :   Yes   Arnett, RN, Christina - 04/10/2019 6:39 EDT   ID Risk Screen Symptoms   Recent Travel History :   No recent travel   Close Contact with COVID-19 ID :   Preadmission testing patients only  Last 14 days COVID-19 ID :   Yes - Not Detected (negative)   TB Symptom Screen :   No symptoms   C. diff Symptom/History ID :   Neither of the above   Arnett, RN, Trula Ore - 04/10/2019 6:39 EDT   ID COVID-19 Screen   Fever OR Chills :   No   Headache :   No   New or Worsening Cough :   No   Fatigue :   No   Shortness of Breath ID :   No   Myalgia (Muscle Pain) :   No   Dyspnea :   No   Diarrhea :   Yes   Sore Throat :   No   Nausea :   No   Laryngitis :   No   Sudden Loss of Taste or Smell :   No   Jason Coop RNTrula Ore - 04/10/2019 6:39 EDT   Social History   Social History   (As Of: 04/10/2019 06:49:20 EDT)   Tobacco:        Tobacco use: Former smoker, quit more than 30 days ago.  20 year(s).   (Last Updated: 03/30/2019 08:41:07 EDT by LAKE, RN, JENIFER L)          Alcohol:        Denies   (Last Updated: 11/01/2018 08:37:48 EST by Fayrene Fearing, RN, Trula Ore)          Substance Use:        Current, Marijuana, Daily   Comments:  11/01/2018 8:38 - Fayrene Fearing, RN, Christina: last use per pt 10/31/18   (Last Updated: 11/01/2018 12:09:01 EST by Katrinka Blazing, RN, Maylon Peppers)            Advance Directive   Advance Directive :   No   Arnett, RN, Christina - 04/10/2019 6:39 EDT   PAT Patient Instructions   Patient Arrival Time PAT :   04/10/2019 6:00 EDT   Medications in AM :   AMLODIPINE,  METOPROLOL, =XANAX, ZOFRAN IF NEEDED  OMEPRAZOLE   Medication Understanding :   Verbalizes understanding   NPO PAT :   ERAS Protocol Per MD order   HAUFF, RN, LISA - 04/09/2019 16:02 EDT   PAT Instructions Grid   Burton Apley Understanding :   Verbalizes understanding   Perfume Understanding :   Verbalizes understanding   Valuables Understanding :   Verbalizes understanding   Smoking Understanding :   Verbalizes understanding   Clothing Understanding :   Verbalizes understanding   Contact MD for Illness :   Verbalizes understanding   Contact MD for skin injury :   Verbalizes understanding   HAUFF, RN, LISA - 04/09/2019 16:02 EDT   Service Line PAT :   Gen Surg   Laterality PAT :   N/A   Prep PAT :   Hibiclens   Name of Contact PAT :   [name]   Relationship of Contact PAT :   WIFE   Contact Number PAT :   [phone]   Transportation Instructions PAT :   Accompany to Hospital, Transport Home, Remain with 24 hours post-procedure   Additional Contacts PAT :   COVID 03/31/2019;   7/4 @ 0936 pt states he did not get COVID test on 7/3, message sent to Dr Wynelle Beckmann; Urgent, so must proceed; Anesthesia and Peri-op teams notified; HF 04/03/2019 rescreened. no symptoms    07/06 INSTRUCTED TO GET COVID 04/06/2019 [facility]   Additional Comments PAT :   07/12 MB not  set up, unable to reach for covid prescreen.lhrn   HAUFF, RN, LISA - 04/09/2019 16:02 EDT   Harm Screen   Feels Unsafe at Home :   No   Last 3 mo, thoughts killing self/others :   Patient denies   Jason Coop, RNTrula Ore - 04/10/2019 6:39 EDT

## 2019-04-09 NOTE — Nursing Note (Signed)
Adult Admission Assessment - Text       Perioperative Admission Assessment Entered On:  04/09/2019 16:00 EDT    Performed On:  04/09/2019 16:00 EDT by HAUFF, RN, LISA               General   Call Start :   04/03/2019 15:48 EDT   Call Complete :   04/03/2019 15:55 EDT   Information Given By :   Self, Spouse, Written documentation   Height/Length Estimated :   185.4 cm(Converted to: 6 ft 1 in, 6.08 ft, 72.99 in)    BMI   Estimated :   101.36 kg(Converted to: 223 lb 7 oz, 223.461 lb)    Body Mass Index Estimated :   29.49 kg/m2   Primary Care Physician/Specialists :   dr snyder-pcp   Emergency Contact Name :   [name]-WIFE   Emergency Contact Phone :   [phone]   Languages :   Tressia Danas, RN, LISA - 04/09/2019 16:00 EDT   PAT Patient Instructions   Patient Arrival Time PAT :   04/10/2019 6:00 EDT   Medications in AM :   AMLODIPINE, METOPROLOL, =XANAX, ZOFRAN IF NEEDED  OMEPRAZOLE   Medication Understanding :   Verbalizes understanding   NPO PAT :   ERAS Protocol Per MD order   HAUFF, RN, LISA - 04/09/2019 16:00 EDT   PAT Instructions Grid   Wynonia Lawman Understanding :   Verbalizes understanding   Perfume Understanding :   Verbalizes understanding   Valuables Understanding :   Verbalizes understanding   Smoking Understanding :   Verbalizes understanding   Clothing Understanding :   Verbalizes understanding   Contact MD for Illness :   Verbalizes understanding   Contact MD for skin injury :   Verbalizes understanding   HAUFF, RN, LISA - 04/09/2019 16:00 EDT   Service Line PAT :   Gen Surg   Laterality PAT :   N/A   Prep PAT :   Hibiclens   Name of Contact PAT :   [name]   Relationship of Contact PAT :   WIFE   Contact Number PAT :   [phone]   Transportation Instructions PAT :   Accompany to [place] with 24 hours post-procedure   Additional Contacts PAT :   COVID 03/31/2019;   7/4 @ 0936 pt states he did not get COVID test on 7/3, message sent to Dr Colonel Bald; Urgent, so must proceed; Anesthesia  and Peri-op teams notified; HF 04/03/2019 rescreened. no symptoms    07/06 INSTRUCTED TO GET COVID 04/06/2019 [facility]   Additional Comments PAT :   07/12  unable to reach pt, MB no set up lhrn   HAUFF, RN, LISA - 04/09/2019 16:00 EDT

## 2019-04-10 LAB — POCT GLUCOSE
POC Glucose: 100 mg/dL (ref 70.0–120.0)
POC Glucose: 197 mg/dL — ABNORMAL HIGH (ref 70.0–120.0)
POC Glucose: 185 mg/dL — ABNORMAL HIGH (ref 70.0–120.0)
POC Glucose: 159 mg/dL — ABNORMAL HIGH (ref 70.0–120.0)
POC Glucose: 155 mg/dL — ABNORMAL HIGH (ref 70.0–120.0)
POC Glucose: 142 mg/dL — ABNORMAL HIGH (ref 70.0–120.0)
POC Glucose: 154 mg/dL — ABNORMAL HIGH (ref 70.0–120.0)
POC Glucose: 142 mg/dL — ABNORMAL HIGH (ref 70.0–120.0)
POC Glucose: 135 mg/dL — ABNORMAL HIGH (ref 70.0–120.0)

## 2019-04-10 LAB — CBC
Hematocrit: 37.9 % — ABNORMAL LOW (ref 38.0–52.0)
Hemoglobin: 12.7 g/dL (ref 12.0–17.3)
MCH: 30.6 pg (ref 27.0–34.5)
MCHC: 33.5 g/dL (ref 32.0–36.0)
MCV: 91.3 fL (ref 84.0–100.0)
MPV: 11 fL (ref 7.2–13.2)
NRBC Absolute: 0 10*3/uL
NRBC Automated: 0 % (ref 0.0–0.2)
Platelets: 277 10*3/uL (ref 140–440)
RBC: 4.15 x10e6/mcL (ref 4.00–5.20)
RDW: 13.1 % (ref 11.0–16.0)
WBC: 6.4 10*3/uL (ref 3.8–10.6)

## 2019-04-10 LAB — ABO/RH: ABO/Rh: O POS

## 2019-04-10 LAB — CREATININE
Creatinine: 0.8 mg/dL (ref 0.7–1.3)
GFR African American: 106 mL/min/{1.73_m2} (ref >=90)
GFR Non-African American: 92 mL/min/{1.73_m2} (ref >=90)

## 2019-04-10 LAB — ANTIBODY SCREEN: Antibody Screen: NEGATIVE

## 2019-04-10 NOTE — Assessment & Plan Note (Signed)
PreOp Record - [facility]             PreOp Record - [facility] Summary                                                                     Primary Physician:        Suanne MarkerLAGARES-GARCIA-MD,                              JORGE    Case Number:              [id number]    Finalized Date/Time:      04/10/19 07:41:15    Pt. Name:                 [name], [name]    D.O.B./Sex:               06-08-1951    Male    Med Rec #:                [id number]    Physician:                Suanne MarkerLAGARES-GARCIA-MD,                              JORGE    Financial #:              [id number]    Pt. Type:                 I    Room/Bed:                 /    Admit/Disch:              04/10/19 06:26:00 -    Institution:       [facility] Case Attendance - PreOp                                                                                              Entry 1                         Entry 2                                                                          Case Attendee             LAGARES-GARCIA-MD,              Arnett, RN,  Daisy Florohristina                              JORGE    Role Performed            Surgeon Primary                 Preoperative Nurse    Time In                                                   04/10/19 06:25:00    Time Out                                                  04/10/19 07:40:00    Last Modified By:         Jason CoopArnett, RN, Teodoro Sprayhristina           Arnett, RN, Christina                              04/10/19 06:24:50               04/10/19 07:40:21      [facility] Case Attendance - PreOp Audit                                                               04/10/19 07:40:21         Owner: [id number]                             Modifier: [email or web address]                                                      <+> 2         Time Out        [facility] - Case Times - PreOp                                                                                                 Entry 1  Patient In Room Time      04/10/19 06:25:00               Nurse In Time                   04/10/19 06:25:00    Nurse Out Time            04/10/19 07:40:00               Patient Ready for               04/10/19 07:40:00                                                              Surgery/Procedure     Last Modified By:         Vidal Schwalbe, RN, Christina                              04/10/19 07:40:25      [facility] - Case Times - PreOp Audit                                                                  04/10/19 07:40:25         Owner: [email or web address]                             Modifier: [email or web address]                                                      <+> 1         Patient Ready for Surgery/Procedure        <+> 1         Nurse Out Time                Finalized By: Charlton Haws      Document Signatures                                                                             Signed By:           Vidal Schwalbe RN, Christina 04/10/19 07:41

## 2019-04-10 NOTE — Wound Image (Signed)
Wound Care Nurse Note                 WOC RN to bedside for preop stoma marking per Dr. Wynelle Beckmann.    Patient's abdomen assessed while sitting up at 90degrees.    Large horizontal lower abdominal scar outlined.  Umbilicus crease outlined.  Abdominal rectus muscles assessed with patient cough.    WOC RN marked patient's abdomen for possible stoma placement along RUQ and LUQ within the rectus muscles, away from creases and scarring, along a flat pouching surface within the patient's visual field.    WOC RN to follow up post op as needed.   Signature [employer]

## 2019-04-10 NOTE — Anesthesia Post-Procedure Evaluation (Signed)
Postanesthesia Evaluation        Patient:   [name], [name]            MRN: [id number]            FIN: [id number]               Age:   68 years     Sex:  Male     DOB:  1951/02/16   Associated Diagnoses:   None   Author:   Adaline Sill A.-MD      Postoperative Information   Post Operative Info:          Post operative day: Post Anesthesia Care Unit.         Patient location: PACU.       Assessment   Postanesthesia assessment   Vitals: Vital Signs   11/17/2540 70:62 EDT Systolic Blood Pressure 376 mmHg  >HHI    Diastolic Blood Pressure 92 mmHg  HI    Temperature Oral 36.5 degC    Heart Rate Monitored 88 bpm    Respiratory Rate 20 br/min    SpO2 98 %   2/83/1517 6:16 EDT Systolic Blood Pressure 073 mmHg  HI    Diastolic Blood Pressure 78 mmHg    Temperature Oral 37.0 degC    Heart Rate Monitored 78 bpm    Respiratory Rate 16 br/min    SpO2 97 %    SBP/DBP Cuff Details Left arm, Automated      , Measurements from flowsheet : Measurements   04/10/2019 6:51 EDT Body Mass Index est meas 29.01 kg/m2    Body Mass Index Measured 29.01 kg/m2   04/10/2019 6:49 EDT Height/Length Measured 185.4 cm    Height/Length Estimated 185.4 cm    Patient Stated Height/Length 99 cm    Weight Measured 99.7 kg   04/09/2019 16:02 EDT Height/Length Estimated 185.4 cm    Weight Estimated 101.36 kg    Body Mass Index Estimated 29.49 kg/m2   04/09/2019 16:00 EDT Height/Length Estimated 185.4 cm    Weight Estimated 101.36 kg    Body Mass Index Estimated 29.49 kg/m2   04/09/2019 15:58 EDT Height/Length Estimated 185.4 cm    Weight Estimated 101.36 kg    Body Mass Index Estimated 29.49 kg/m2      .     Cardiovascular function: Heart Rate stable, Blood Pressure stable, Postoperative hydration status Adequate.     Mental status: appropriate for level of anesthesia.     Temperature: within normal limits.     Pain Control: Adequate.     Nausea/Vomiting: Absent.     Notes: patient tolerated well without anesthetic complications.     Signature Line      Electronically Signed on 04/10/2019 10:56 AM EDT   ________________________________________________   Adaline Sill A.-MD, MD

## 2019-04-10 NOTE — Op Note (Signed)
Phase I Record - [facility]             Phase I Record - [facility] Summary                                                                   Primary Physician:        Caralee Ates    Case Number:              [id number]    Finalized Date/Time:      04/10/19 14:01:40    Pt. Name:                 [name], [name]    D.O.B./Sex:               1951-01-25    Male    Med Rec #:                [id number]    Physician:                Caralee Ates    Financial #:              [id number]    Pt. Type:                 I    Room/Bed:                 /    Admit/Disch:              04/10/19 06:26:00 -    Institution:       [facility] Case Attendance - Phase I                                                                                            Entry 1                         Entry 2                                                                          Case Attendee             LAGARES-GARCIA-MD,              Elicia Lamp, RN, Margaretmary Bayley  Domenica ReamerN                              JORGE    Role Performed            Surgeon Primary                 Post Anesthesia Care                                                              Nurse    Time In     Time Out     Last Modified By:         Arlyss RepressShaffer, RN, Nanda QuintonKyndra N           Shaffer, RN, Cathlyn ParsonsKyndra N                              04/10/19 14:01:17               04/10/19 14:01:26      [facility] Case Attendance - Phase I Audit                                                             04/10/19 14:01:26         Owner: [id number]                              Modifier: [id number]                                                       <+> 2         Case Attendee        <+> 2         Role Performed        [facility] - Case Times - Phase I                                                                                               Entry 1  Phase I In                 04/10/19 10:42:00               Phase I Out                     04/10/19 13:02:00    Phase I Discharge         04/10/19 13:02:00    Time     Last Modified By:         Arlyss RepressShaffer, RN, Cathlyn ParsonsKyndra N                              04/10/19 14:01:39              Finalized By: Arlyss RepressShaffer, RN, Cathlyn ParsonsKyndra N      Document Signatures                                                                             Signed By:           Arlyss RepressShaffer, RN, Cathlyn ParsonsKyndra N 04/10/19 14:01

## 2019-04-10 NOTE — Op Note (Signed)
Phase II Record - [facility]             Phase II Record - [facility] Summary                                                                  Primary Physician:        Suanne MarkerLAGARES-GARCIA-MD,                              JORGE    Case Number:              [id number]    Finalized Date/Time:      04/10/19 14:03:31    Pt. Name:                 Louis AlanisHONEYCUTT, [name]    D.O.B./Sex:               1951/08/23    Male    Med Rec #:                [id number]    Physician:                Suanne MarkerLAGARES-GARCIA-MD,                              JORGE    Financial #:              [id number]    Pt. Type:                 I    Room/Bed:                 /    Admit/Disch:              04/10/19 06:26:00 -    Institution:       [facility] Case Attendance - Phase II                                                                                           Entry 1                                                                                                          Case Attendee             Arlyss RepressShaffer, RN, Cathlyn ParsonsKyndra N  Role Performed                  Post Anesthesia Care                                                                                              Nurse    Last Modified By:         Elicia Lamp, RN, Gaetano Net                              04/10/19 14:01:48      [facility] - Case Times - Phase II                                                                                              Entry 1                                                                                                          Phase II In               04/10/19 13:02:00               Phase II Out                    04/10/19 14:03:00    Phase II Discharge        04/10/19 14:03:00    Time     Last Modified By:         Ezequiel Ganser                              04/10/19 14:03:24      [facility] - Case Times - Phase II Audit                                                               04/10/19 14:03:24         Owner: [id number]  Modifier: [id number]                                                        <+> 1         Phase II Out        <+> 1         Phase II Discharge Time                Finalized By: Hulen SkainsShaffer, RN, Kyndra N      Document Signatures                                                                             Signed By:           Arlyss RepressShaffer, RN, Cathlyn ParsonsKyndra N 04/10/19 14:03

## 2019-04-10 NOTE — Procedures (Signed)
IntraOp Record - [facility]             IntraOp Record - [facility] Summary                                                                   Primary Physician:        Caralee Ates    Case Number:              [id number]    Finalized Date/Time:      04/10/19 10:41:27    Pt. Name:                 [name], [name]    D.O.B./Sex:               11/03/1950    Male    Med Rec #:                [id number]    Physician:                Caralee Ates    Financial #:              [id number]    Pt. Type:                 I    Room/Bed:                 /    Admit/Disch:              04/10/19 06:26:00 -    Institution:       [facility] - Case Times                                                                                                         Entry 1                                                                                                          Patient      In Room Time  04/10/19 07:41:00               Out Room Time                   04/10/19 10:41:00    Anesthesia     Procedure      Start Time               04/10/19 08:09:00               Stop Time                       04/10/19 10:30:00    Last Modified By:         Kathlynn Grate                              04/10/19 10:41:24    General Comments:            CONVERTED TO OPEN PROCEDURE AT 9:14AM      [facility] - Case Times Audit                                                                          04/10/19 10:41:24         Owner: [id number]                              Modifier: [id number]                                                       <+> 1         Out Room Time     04/10/19 10:30:40         Owner: [id number]                              Modifier: [id number]                                                           1     <*> Stop Time     04/10/19 08:20:46         Owner: [id number]                              Modifier: [id number]                                                           1      <*>  Start Time                             04/10/19 08:09:00            1     <*> Start Time                             04/10/19 08:09:00            1     <*> Start Time                             04/10/19 08:09:00            1     <*> Start Time                             04/10/19 08:09:00            1     <*> Start Time                             04/10/19 08:09:00            1     <*> Start Time            1     <*> Start Time            1     <*> Start Time            1     <*> Start Time     04/10/19 08:09:07         Owner: [id number]                              Modifier: [id number]                                                       <+> 1         Start Time        [facility] - Case Attendance                                                                                                    Entry 1                         Entry 2                         Entry 3  Case Attendee             Adaline Sill A.-MD             LAGARES-GARCIA-MD,              Janece Canterbury    Role Performed            Anesthesiologist                Surgeon Primary                 First Assistant    Time In                   04/10/19 07:41:00               04/10/19 07:41:00               04/10/19 07:41:00    Time Out     Procedure                 Colectomy Right XI              Colectomy Right XI              Colectomy Right XI                              Robotic                         Robotic                         Robotic    Last Modified By:         Jill Alexanders RN, Nani Skillern, RN, Nani Skillern, RN, Kingsley Spittle                              04/10/19 10:21:49               04/10/19 10:21:49               04/10/19 10:21:49                                Entry 4                         Entry 5                         Entry 6  Case Attendee              Nancie Neas, RN, Beau Fanny, RN, Joneen Caraway, RN, [place] F    Role Performed            Circulator Add'l                [place]                        Circulator    Time In                   04/10/19 07:41:00               04/10/19 07:41:00               04/10/19 07:41:00    Time Out                  04/10/19 08:00:00    Procedure                 Colectomy Right XI              Colectomy Right XI              Colectomy Right XI                              Robotic                         Robotic                         Robotic    Last Modified By:         Jill Alexanders RN, Nani Skillern, RN, Nani Skillern, RN, Kingsley Spittle                              04/10/19 10:21:49               04/10/19 10:21:49               04/10/19 10:21:49                                Entry 7                         Entry 8                                                                          Case Attendee             Alric Seton  Tawni Levy    Role Performed            Surgical Scrub                  Surgical Resident    Time In                   04/10/19 07:41:00               04/10/19 07:41:00    Time Out     Procedure                 Colectomy Right XI              Colectomy Right XI                              Robotic                         Robotic    Last Modified By:         Jill Alexanders RN, Nani Skillern, RN, Kingsley Spittle                              04/10/19 10:21:49               04/10/19 10:21:49      [facility] - Case Attendance Audit                                                                     04/10/19 10:21:49         Owner: [id number]                              Modifier: [id number]                                                           1     <*> Procedure                              Possible Open Robotic Procedure (Use Onl, Colectomy Right XI                                                             Robotic            2     <*>  Procedure                              Possible Open Robotic Procedure (  Use Onl, Colectomy Right XI                                                             Robotic            3     <*> Procedure                              Possible Open Robotic Procedure (Use Onl, Colectomy Right XI                                                             Robotic            4     <*> Procedure                              Possible Open Robotic Procedure (Use Onl, Colectomy Right XI                                                             Robotic            5     <*> Procedure                              Possible Open Robotic Procedure (Use Onl, Colectomy Right XI                                                             Robotic            6     <*> Procedure                              Possible Open Robotic Procedure (Use Onl, Colectomy Right XI                                                             Robotic            7     <*> Procedure                              Possible Open Robotic Procedure (Use Onl, Colectomy Right XI  Robotic            8     <*> Procedure                              Possible Open Robotic Procedure (Use Onl, Colectomy Right XI                                                             Robotic     04/10/19 09:46:30         Owner: [id number]                              Modifier: [id number]                                                           1     <*> Procedure                              Possible Open Robotic Procedure (Use Onl, Colectomy Right XI                                                             Robotic            2     <*> Procedure                              Possible Open Robotic Procedure (Use Onl, Colectomy Right XI                                                             Robotic            3     <*> Procedure                              Possible Open Robotic Procedure (Use Onl, Colectomy Right XI                                                              Robotic            4     <*> Procedure  Possible Open Robotic Procedure (Use Onl, Colectomy Right XI                                                             Robotic            5     <*> Procedure                              Possible Open Robotic Procedure (Use Onl, Colectomy Right XI                                                             Robotic            6     <*> Procedure                              Possible Open Robotic Procedure (Use Onl, Colectomy Right XI                                                             Robotic            7     <*> Procedure                              Possible Open Robotic Procedure (Use Onl, Colectomy Right XI                                                             Robotic            8     <+> Time In            8     <*> Procedure                              Possible Open Robotic Procedure (Use Onl, Colectomy Right XI                                                             Robotic     04/10/19 08:44:56         Owner: [id number]  Modifier: [id number]                                                       <+> 8         Case Attendee        <+> 8         Role Performed        <+> 8         Procedure     04/10/19 08:44:16         Owner: [id number]                              Modifier: [id number]                                                           6     <+> Role Performed            6     <*> Procedure                              Possible Open Robotic Procedure (Use Onl, Colectomy Right XI                                                             Robotic     04/10/19 08:42:37         Owner: [id number]                              Modifier: [id number]                                                           1     <*> Procedure                              Possible Open Robotic Procedure (Use Onl, Colectomy Right XI                                                              Robotic            2     <*> Procedure                              Possible Open Robotic Procedure (Use Onl,  Colectomy Right XI                                                             Robotic            3     <*> Procedure                              Possible Open Robotic Procedure (Use Onl, Colectomy Right XI                                                             Robotic            4     <*> Procedure                              Possible Open Robotic Procedure (Use Onl, Colectomy Right XI                                                             Robotic            5     <*> Procedure                              Possible Open Robotic Procedure (Use Onl, Colectomy Right XI                                                             Robotic            6     <*> Procedure                              Possible Open Robotic Procedure (Use Onl, Colectomy Right XI                                                             Robotic            7     <+> Time In            7     <*> Procedure                              Possible Open  Robotic Procedure (Use Onl, Colectomy Right XI                                                             Robotic     04/10/19 08:41:50         Owner: [id number]                              Modifier: [id number]                                                           1     <*> Procedure                              Possible Open Robotic Procedure (Use Onl, Colectomy Right XI                                                             Robotic            2     <*> Procedure                              Possible Open Robotic Procedure (Use Onl, Colectomy Right XI                                                             Robotic            3     <*> Procedure                              Possible Open Robotic Procedure (Use Onl, Colectomy Right XI                                                             Robotic            4     <+> Time Out            4     <*> Procedure                               Possible Open Robotic Procedure (Use Onl, Colectomy Right XI  Robotic            5     <*> Procedure                              Possible Open Robotic Procedure (Use Onl, Colectomy Right XI                                                             Robotic            6     <+> Time In            6     <*> Procedure                              Possible Open Robotic Procedure (Use Onl, Colectomy Right XI                                                             Robotic        <+> 7         Case Attendee        <+> 7         Role Performed        <+> 7         Procedure     04/10/19 08:19:44         Owner: [id number]                              Modifier: [id number]                                                           1     <*> Procedure                              Possible Open Robotic Procedure (Use Onl, Colectomy Right XI                                                             Robotic            2     <*> Procedure                              Possible Open Robotic Procedure (Use Onl, Colectomy Right XI  Robotic            3     <*> Procedure                              Possible Open Robotic Procedure (Use Onl, Colectomy Right XI                                                             Robotic            4     <+> Time In            4     <*> Procedure                              Possible Open Robotic Procedure (Use Onl, Colectomy Right XI                                                             Robotic            5     <+> Time In            5     <*> Procedure                              Possible Open Robotic Procedure (Use Onl, Colectomy Right XI                                                             Robotic        <+> 6         Case Attendee        <+> 6         Procedure     04/10/19 08:03:09         Owner: [id number]                               Modifier: [id number]                                                           1     <*> Procedure                              Possible Open Robotic Procedure (Use Onl, Colectomy Right XI  Robotic            2     <*> Procedure                              Possible Open Robotic Procedure (Use Onl, Colectomy Right XI                                                             Robotic            3     <+> Time In            3     <*> Procedure                              Possible Open Robotic Procedure (Use Onl, Colectomy Right XI                                                             Robotic        <+> 4         Case Attendee        <+> 4         Role Performed        <+> 4         Procedure        <+> 5         Case Attendee        <+> 5         Role Performed        <+> 5         Procedure     04/10/19 08:02:05         Owner: [id number]                              Modifier: [id number]                                                           1     <+> Time In            1     <*> Procedure                              Possible Open Robotic Procedure (Use Onl, Colectomy Right XI                                                             Robotic  2     <+> Time In            2     <*> Procedure                              Possible Open Robotic Procedure (Use Onl, Colectomy Right XI                                                             Robotic        <+> 3         Case Attendee        <+> 3         Role Performed        <+> 3         Procedure        [facility] - Skin Assessment                                                                          Pre-Care Text:            A.240 Assesses baseline skin condition Im.120 Implements protective measures to prevent skin or tissue injury           due to mechanical sources  Im.280.1 Implements progective measures to prevent skin or tissue injury due to           thermal sources Im.360 Monitors for signs  and symptons of infection                              Entry 1                                                                                                          Skin Integrity            Intact    Last Modified By:         Jill Alexanders, RN, Kingsley Spittle                              04/10/19 91:47:82    Post-Care Text:            E.10 Evaluates for signs and symptoms of physical injury to skin and tissue E.270 Evaluate tissue perfusion           O.60 Patient is free from signs and symptoms of injury caused by extraneous objects   O.210  Patinet's tissue           perfusion is consistent with or improved from baseline levels      [facility] - Patient Positioning                                                                      Pre-Care Text:            A.240 Assesses baseline skin condition A.280 Identifies baseline musculoskeletal status A.280.1 Identifies           physical alterations that require additional precautions for procedure-specific positioning A.510.8 Maintains           patient's dignity and privacy Im.120 Implements protective measures to prevent skin/tissue injury due to           mechanical sources Im.40 Positions the patient Im.80 Applies safety devices                              Entry 1                                                                                                          Procedure                 Colectomy Right XI              Body Position                   Supine                              Robotic    Left Arm Position         Tucked and Padded at            Right Arm Position              Tucked and Padded at                              Side                                                            Side    Left Leg Position         Extended Security Strap         Right Leg Position              Extended Security Strap    Feet Uncrossed  Yes                             Pressure Points                 Yes                                                               Checked     Additional                Hug-a-Vac strapped to           Positioning Device              Safety Strap, Head Rest    Information               bed, arms tucked with                                           Foam, Arm Cradle                              narrow draw sheet &                                             Foam/Gel, Bean Bag,                              FOAM headrest to                                                Foam Padding, Head Rest                              Anesthesia for                                                  Gel                              protection of face.                              shoulders padded with                              foam bilaterally    Positioned By             Beryle Lathe,  Outcome Met (O.80)              Yes                              CHRISTOPHER, Orvin, RN,                              Joneen Caraway, RN,                              Glee Arvin                              A.-MD    Last Modified By:         Jill Alexanders RN, Kingsley Spittle                              04/10/19 10:21:50    Post-Care Text:            A.240 Assesses baseline skin condition A.280 Identifies baseline musculoskeletal status A.280.1 Identifies           physical alterations that require additional precautions for procedure-specific positioning A.510.8 Maintains           patient's dignity and privacy Im.120 Implements protective measures to prevent skin/tissue injury due to           mechanical sources Im.40 Positions the patient Im.80 Applies safety devices      [facility] - Patient Positioning Audit                                                                 04/10/19 10:21:50         Owner: [id number]                              Modifier: [id number]                                                           1     <*> Procedure                              Possible Open Robotic Procedure (Use Onl, Colectomy Right XI                                                              Robotic     04/10/19 08:51:08         Owner: [id number]  Modifier: [id number]                                                           1     <*> Positioned By                          LAGARES-GARCIA-MD,  JORGE            1     <*> Positioning Device                     Safety Strap, Head Rest Foam, Other/See Comments            1     <*> Procedure                              Possible Open Robotic Procedure (Use Onl, Colectomy Right XI                                                             Robotic            1     <*> Additional Information                 Hug-a-Vac strapped to bed, arms tucked with narrow draw sheet &                                                             Foam headrest to Anesthesia for protection of face. shoulders                                                             padded with foam bilaterally        [facility] - Skin Prep                                                                                Pre-Care Text:            A.30 Verifies allergies A.20 Verifies procedure, surgical site, and laterality A.510.8 Maintains paritnet's           dignity and privacy Im.270 Performs Skin Preparation Im.270.1 Implements protective measures to prevent skin           and tissue injury due to chemical sources  A.300.1 Protects from cross-contamination  Entry 1                                                                                                          Hair Removal     Skin Prep      Prep Agents (Im.270)     Chlorhexidine Gluconate         Prep Area (Im.270)              Abdomen/Mons pubis                              2% w/Alcohol     Prep Area Details        Bilateral                       Prep By                         Annamarie Major    Outcome Met (O.100)       Yes    Last Modified By:         Jill Alexanders, RN, Kingsley Spittle                               04/10/19 08:02:28    Post-Care Text:            E.10 Evaluates for signs and symptoms of physical injury to skin and tissue O.100 Patient is free from signs           and symptoms of chemical injury  O.740 The patient's right to privacy is maintained      [facility] - Counts Initial and Final                                                                 Pre-Care Text:            A.20.2 - Assesses the risk for unintended retained surgical items Im.20 - Performs required counts                              Entry 1  Initial Counts      Initial Counts           Nancie Neas, RN, Santiago Glad           Items included in               Instruments, Sponges,     Performed By             Chevis Pretty N             the Initial Count               Sharps    Final Counts      Final Counts             Amalia Hailey  Cynda Familia,               Final Count Status              Correct     Performed By             Casper Harrison, Kingsley Spittle     Items Included in        Sharps     Final Count     Outcome Met (O.20)        Yes    Last Modified By:         Kathlynn Grate                              04/10/19 10:30:25    Post-Care Text:            E.50 - Evaluates results of the surgical count O.20 - Patient is free from unintended retained surgical items      [facility] - Counts Initial and Final Audit                                                            04/10/19 10:30:25         Owner: [id number]                              Modifier: [id number]                                                           1     <*> Final Counts Performed By              Sinda Du N            1     <+> Final Count Status            1     <+> Items Included in Final Count            1     <+> Outcome Met (O.20)     04/10/19 08:46:36         Owner: [id number]  Modifier: [id number]                                                            1     <*> Items included in the Initial Count    Instruments, Sponges, Sharps, Other/See Comments     04/10/19 08:45:34         Owner: [id number]                              Modifier: [id number]                                                           1     <*> Initial Counts Performed By            Nancie Neas, RN, Deliah Goody            1     <+> Final Counts Performed By        [facility] - Counts Additional                                                                        Pre-Care Text:            A.13 Verifies operative procedure, sugical site, and laterality A.20.2 Assesses the risk for unintended           retained foreign body Im.20 Performs required counts                              Entry 1                         Entry 2                                                                          Additional Count          Other/See Comments              Closing Count    Type     Additional Count          Amalia Hailey  Ebony Cargo, RN, Kingsley Spittle,    Participants              ROPER, RN, Quinn Plowman,  Tressa N    Count  Status              Correct                         Correct    Items Counted             Instruments, Sponges            Instruments, Sponges,                                                              Sharps    Outcome Met (O.20)        Yes                             Yes    Last Modified By:         Jill Alexanders, RN, Nani Skillern, RN, Kingsley Spittle                              04/10/19 10:21:37               04/10/19 10:21:37    Post-Care Text:            E.50 Evaluates results of the surgical count O.20 Patient is free from unintended retained foreign objects      [facility] - Counts Additional Audit                                                                   04/10/19 10:21:37         Owner: [id number]                              Modifier: [id number]                                                           1     <*> Additional Count Type                  Closing Count             1     <*> Additional Count Participants          Amalia Hailey,  Claudette Head N            1     <+> Count Status            1     <+> Items Counted            1     <+> Outcome Met (O.20)        <+> 2  Additional Count Type        <+> 2         Additional Count Participants        <+> 2         Count Status        <+> 2         Items Counted        <+> 2         Outcome Met (O.20)     04/10/19 08:45:47         Owner: [id number]                              Modifier: [id number]                                                       <+> 1         Additional Count Participants        [facility] - General Case Data                                                                        Pre-Care Text:            A.350.1 Classifies surgical wound                              Entry 1                                                                                                          Case Information      ASA Class                3                               Case Level                      Level 4     OR                       RH7 03                          Specialty                       General Robotic (SN)     Wound Class  2-Clean-Contaminated    Preop Diagnosis           ILEAL CROHNS STRICTURE          Postop Diagnosis                ILEAL CROHNS STRICTURE                              / SMALL BOWEL                                                   / SMALL BOWEL                              OBSTRUCTION                                                     OBSTRUCTION    Last Modified By:         Kathlynn Grate                              04/10/19 08:42:01    Post-Care Text:            O.760 Patient receives consistent and comparable care regardless of the setting      [facility] - General Case Data Audit                                                                   04/10/19 08:42:01         Owner: [id number]                              Modifier: [id number]                                                       <+> 1          Wound Class        [facility] - Fire Risk Assessment                                                                                               Entry 1  Fire Risk                 Alcohol Based Prep              Fire Risk Score                 2    Assessment: If            Solution, Ignition    checked, checkmark        Source In Use    = 1 point     Last Modified By:         Jill Alexanders, RN, Kingsley Spittle                              04/10/19 08:23:45      [facility] - Time Out                                                                                 Pre-Care Text:            A.10 Confirms patient identity A.20 Verifies operative procedure, surgical site, and laterality A.20.1 Verifies           consent for planned procedure A.30 Verifies allergies                              Entry 1                                                                                                          Procedure                 Colectomy Right XI              Is everyone ready               Yes                              Robotic                         to perform time out     Have all members of       Yes                             Patient name and                Yes    the surgical team  DOB confirmed     been introduced     Allergies discussed       Yes                             Surgical procedure              Yes                                                              to be performed                                                               confirmed and                                                               verified by                                                               completed surgical                                                               consent     Correct surgical          Yes                             Correct laterality              Yes    site  marked and                                           confirmed     initials are     visible through     prepped and draped     field (or     alternative ID band     used)     Correct patient           Yes                             Surgeon shares                  Yes    position  confirmed                                        operative plan,                                                               possible                                                               difficulties,                                                               expected duration,                                                               anticipated blood                                                               loss and reviews                                                               all                                                               critical/specific                                                               concerns     Required blood            Yes                             Essential imaging  Yes    products, implants,                                       available and fetal     devices and/or                                            heartones confirmed     special equipment                                         (if applicable)     available and     sterility confirmed     VTE prophylaxis           Yes                             Antibiotics ordered             Yes    addressed                                                 and administered     Anesthesia shares         Yes                             Fire risk                       Yes    anesthetic plan and                                       assessment scored     reviews patient                                           and plan discussed     specific concerns     Appropriate drying        Yes                             Surgeon states:                 Yes    time for prep                                              Does anyone have     observed before  any concerns? If     draping                                                   you see, suspect,                                                               or feel that                                                               patient care is                                                               being compromised,                                                               speak up for                                                               patient safety     Time Out Complete         04/10/19 08:08:00    Last Modified By:         Kathlynn Grate                              04/10/19 10:21:50    Post-Care Text:            E.30 Evaluates verification process for correct patient, site, side, and level surgery      [facility] - Time Out Audit                                                                            04/10/19 10:21:50         Owner: [id number]  Modifier: [id number]                                                           1     <*> Procedure                              Possible Open Robotic Procedure (Use Onl, Colectomy Right XI                                                             Robotic     04/10/19 08:09:04         Owner: [id number]                              Modifier: [id number]                                                           1     <*> Procedure                              Possible Open Robotic Procedure (Use Onl, Colectomy Right XI                                                             Robotic            1     <+> Time Out Complete        [facility] - Debrief                                                                                  Pre-Care Text:            Im.330 Manages specimen handling and disposition                              Entry 1  Procedure                  Colectomy Right XI              Final counts                    Yes                              Robotic                         correct and                                                               verbally verified                                                               with                                                               surgeon/licensed                                                               independent                                                               practitioner (if                                                               applicable)     Actual procedure          Yes                             Postop diagnosis                Yes    performed confirmed                                       confirmed  Wound                     Yes                             Confirm specimens               Yes    classification                                            and specimens     confirmed                                                 labeled                                                               appropriately (if                                                               applicable)     Foley catheter            Yes                             Patient recovery                Yes    removed (if                                               plan confirmed     applicable)     Debrief Complete          04/10/19 10:22:00    Last Modified By:         Kathlynn Grate                              04/10/19 10:22:47    Post-Care Text:            E.800 Ensures continuity of care E.50 Evaluates results of the surgical count O.30 Patient's procedure is           performed on the correct site, side, and level O.50 patient's current status is communicated throughout the           continuum of care O.40 Patient's specimen(s) is managed in the appropriate manner      [facility] - Debrief Audit  04/10/19 10:22:47         Owner: [id number]                              Modifier: [id number]                                                           1     <*> Procedure                              Colectomy Right XI Robotic            1     <+> Debrief Complete     04/10/19 10:21:51         Owner: [id number]                              Modifier: [id number]                                                           1     <*> Procedure                              Colectomy Right XI Robotic        [facility] - ERAS                                                                                                               Entry 1                                                                                                          ERAS Debrief      IV fluid volume,         Yes     EBL, blood product      volume and urine      output confirmed     ERAS Components      Wound protector used     Yes  Scrubbed staff                  Yes                                                              re-glove and re-gown      Sterile closing          Yes     tray used     Last Modified By:         Jill Alexanders RN, Kingsley Spittle                              04/10/19 10:23:30      [facility] - Cautery                                                                                  Pre-Care Text:            A.240 Assesses baseline skin condition A280.1 Identifies baseline musculoskeletal status Im.50 Implements           protective measures to prevent injury due to electrical sources  Im.60 Uses supplies and equipment within safe           parameters Im.80 Applies safety devices                              Entry 1                                                                                                          ESU Type                  GENERATOR ERBE                  Identification                  [id number]                                                              Number     Coag Setting (watts)      4  Cut Setting (watts)             4    Bipolar Setting           3                               Grounding Pad                   Yes    (watts)                                                   Needed?     Grounding Pad Site        Thigh, right                    [employer], RN, [facility] By     [employer]               [id number] X/EXP:02/21/2021        Outcome Met (O.10)              Yes    Last Modified By:         Jill Alexanders, RN, Kingsley Spittle                              04/10/19 08:41:22    Post-Care Text:            E.10 Evaluates for signs and symptoms of physical injury to skin and tissue O.10 Patient is free from signs and           symptoms of injury related to thermal sources  O.70 Patient is free from signs and symptoms of electrical injury      [facility] Audit                                                                             04/10/19 08:41:22         Owner: [id number]                              Modifier: [id number]  1     <*> ESU Type                               GENERATOR ERBE            1     <+> ESU Comment        [facility] - Patient Care Devices                                                                     Pre-Care Text:            A.200 Assesses risk for normothermia regulation A.40 Verifies presence of prosthetics or corrective devices           Im.280 Implements thermoregulation measures Im.60 Uses supplies and equipment within safe parameters                              Entry 1                         Entry 2                                                                          Equipment Type            MACHINE SEQUENTIAL              BAIR HUGGER                              COMPRESSION    SCD Sleeve Site           Legs Bilateral    Equipment/Tag Number      [id number]                       [id number]    Initiated Pre             Yes     Induction     Last Modified By:         Jill Alexanders RN, Nani Skillern, RN, Kingsley Spittle                              04/10/19 08:25:05               04/10/19 08:25:05    Post-Care Text:            E.10 Evaluates signs and symptoms of physical injury to skin and tissue O.60 Patient is free from signs and           symptoms of injury caused by extraneous objects      [facility] - Medications  Pre-Care Text:            A.10 Confirms patient identity A.30 Verifies allergies Im.220 Administers prescribed medications Im.220.2           Administers prescribed antibiotic therapy as ordered                              Entry 1                                                                                                          Time Administered         04/10/19 10:03:00               Medication                      SOLUTION IRRISEPT 0.05                                                                                              CHG IN STERILE WATER                                                                                              161WR W/ APPLICATOR    Route of Admin            Irrigation                      Site                            Abdomen    Administered By           LAGARES-GARCIA-MD,              Outcome Met (O.130)             Yes                              JORGE    Last Modified By:         Jill Alexanders RN, Kingsley Spittle  04/10/19 10:03:27    Post-Care Text:            E.20 Evaluates response to medications O.130 Patient receives appropriately administerd medication(s)      [facility] - Medications Audit                                                                         04/10/19 10:03:27         Owner: [id number]                              Modifier: [id number]                                                       Entry 1 was deleted.  Higher numbered entries shifted one position to fill the gap.        <-> 1          Medication                             IC GREEN '25MG'$         <-> 1         Route of Admin                         IV        <-> 1         Administered By                        Adaline Sill A.-MD        <-> 1         Time Administered        <-> 1         Outcome Met (O.130)        <-> 1         Site        [facility] - Specimens                                                                                                          Entry 1                         Entry 2  Description               A.ILECOLIC RESECTION            B. PERITONEAL IMPLANT    Specimen Type             Routine                         Routine    Date/Time in     Formalin (for     breast tissue only)     Last Modified By:         Jill Alexanders RN, Nani Skillern, RN, Kingsley Spittle                              04/10/19 09:25:24               04/10/19 09:49:48      [facility] - Specimens Audit                                                                           04/10/19 09:49:48         Owner: [id number]                              Modifier: [id number]                                                       <+> 2         Description        <+> 2         Specimen Type     04/10/19 09:25:24         Owner: [id number]                              Modifier: [id number]                                                           1     <*> Description                            A.ILECOLIC REDO        [facility] - Communication  Pre-Care Text:            A.520 Identifies barriers to communication (Patient and Family Communications) A.20 Verifies operative           procedure, surgical site, and laterality Education officer, museum) Im.500 Provides status reports to family           members Im.150 Develops individualized plan of care                              Entry 1                         Entry 2                         Entry 3                                           Communication             Face to Face                    Phone Call                      Phone Call    Communication By          ROPER, RN, Ledell Peoples, RN, Saul Fordyce, RN, Kingsley Spittle    Date and Time             04/10/19 07:40:00               04/10/19 08:38:00               04/10/19 10:04:00    Communication To          WIFE                            WIFE                            WIFE    Last Modified By:         Jill Alexanders RN, Nani Skillern, RN, Nani Skillern, RN, Kingsley Spittle                              04/10/19 08:43:46               04/10/19 08:43:46               04/10/19 10:04:31                                Entry 4                         Entry  5                                                                          Communication             Phone Call                      Phone Call    Communication By          Nestor Lewandowsky, RN, Macky Lower               LAGARES-GARCIA-MD,                                                              JORGE    Date and Time             04/10/19 10:27:00               04/10/19 10:31:00    Communication To          PACU                            WIFE    Last Modified By:         Jill Alexanders RN, Nani Skillern, RN, Kingsley Spittle                              04/10/19 10:31:12               04/10/19 10:31:12    Post-Care Text:            E.520 Evaluates psychosocial response to plan of care O.500 Patient or designated support person demonstrates           knowledge of the expected psychosocial responses to the procedure E.800 Ensures continuity of care O.50           Patient's current status is communicated throughout the continuum of care      [facility] - Communication Audit                                                                       04/10/19 10:31:12         Owner: [id number]                              Modifier: [id number]                                                       <+>  4          Communication        <+> 4         Communication By        <+> 4         Date and Time        <+> 4         Communication To        <+> 5         Communication        <+> 5         Communication By        <+> 5         Date and Time        <+> 5         Communication To     04/10/19 10:04:31         Owner: [id number]                              Modifier: [id number]                                                       <+> 3         Communication        <+> 3         Communication By        <+> 3         Date and Time        <+> 3         Communication To        [facility] - Dressing/Packing                                                                         Pre-Care Text:            A.350 Assesses susceptibility for infection Im.250 Administers care to invasive devices Im.290 Administer care           to wound sites  Im.300 Implements aseptic technique                              Entry 1                                                                                                          Site  Abdomen    Dressing Item     Details      Dressing Item            Liquid Bandage     (Im.290)     Last Modified By:         Kathlynn Grate                              04/10/19 08:23:36    Post-Care Text:            E.320 Evaluate factors associted with increased risk for postoperative infection at the completion of the           procedure O.200 Patient's wound perfusion is consistent with or improved from baseline levels  O.Patient is           free from signs and symptoms of infection      [facility] - Procedures                                                                               Pre-Care Text:            A.20 Verifies operative procedure, surgical site, and laterality Im.150 Develops individualized plan of care                              Entry 1                                                                                                          Procedure     Description      Procedure                 Colectomy Right XI              Surgical Procedure              LAPAROSCOPIC  XI                              Robotic                         Text                            ROBOTIC REDO ILEOCOLIC  RESECTION    Primary Procedure         Yes                             Primary Surgeon                 Caralee Ates    Start                     04/10/19 08:09:00               Stop                            04/10/19 10:30:00    Anesthesia Type           General                         Surgical Service                General Robotic (SN)    Wound Class               2-Clean-Contaminated    Last Modified By:         Kathlynn Grate                              04/10/19 10:30:44    Post-Care Text:            O.730 The patinet's care is consistent with the individualized perioperative plan of care      [facility] - Procedures Audit                                                                          04/10/19 10:30:44         Owner: [id number]                              Modifier: [id number]                                                       <+> 1         Stop     04/10/19 10:22:08         Owner: [id number]  Modifier: [id number]                                                           1     <*> Procedure                              Possible Open Robotic Procedure (Use Only with Robotic procedure)            1     <*> Primary Procedure                      No            1     <*> Primary Surgeon                        LAGARES-GARCIA-MD,  JORGE            1     <*> Surgical Service                       5JE            1     <*> Start                                  04/10/19 08:09:00            1     <*> Wound Class                            2-Clean-Contaminated            1     <*> Anesthesia Type                         General            1     <*> Surgical Procedure Text                POSS OPEN  REDO ILEOCOLIC RESECTION            2     <-> Procedure                              Colectomy Right XI Robotic            2     <-> Primary Procedure                      Yes            2     <-> Primary Surgeon                        LAGARES-GARCIA-MD,  JORGE            2     <-> Surgical Service            2     <-> Start  04/10/19 08:09:00            2     <-> Wound Class                            2-Clean-Contaminated            2     <-> Anesthesia Type                        General            2     <-> Surgical Procedure Text                LAPAROSCOPIC  XI  ROBOTIC REDO ILEOCOLIC RESECTION     35/57/32 08:42:34         Owner: [id number]                              Modifier: [id number]                                                           2     <*> Procedure                              Colectomy Right XI Robotic            2     <+> Wound Class     04/10/19 08:42:19         Owner: [id number]                              Modifier: [id number]                                                           1     <*> Procedure                              Possible Open Robotic Procedure (Use Only with Robotic procedure)            1     <+> Wound Class            1     <+> Anesthesia Type        [facility] - Transfer  Entry 1                                                                                                          Transferred By            Adaline Sill A.-MD             Via                             Stretcher    Post-op Destination       PACU    Skin Assessment      Condition                Not Intact / Abnormality        Description                     SURGICAL INCISIONS    Last Modified By:         Kathlynn Grate                              04/10/19 08:52:58      [facility] -  Transfer Audit                                                                            04/10/19 08:52:58         Owner: [id number]                              Modifier: [id number]                                                           1     <*> Condition                              Intact            1     <+> Description        Case Comments                                                                                         <  None>              Finalized By: Jill Alexanders RN, Kingsley Spittle      Document Signatures                                                                             Signed By:           Kathlynn Grate 04/10/19 10:41

## 2019-04-10 NOTE — Anesthesia Pre-Procedure Evaluation (Signed)
Preanesthesia Evaluation JC        Patient:   [name], [name]            MRN: [id number]            FIN: [id number]               Age:   68 years     Sex:  Male     DOB:  [birth year]   Associated Diagnoses:   None   Author:   Adaline Sill A.-MD      Preoperative Information   Procedure/ Case: pt presents for: Colectomy Right XI Robotic "LAPAROSCOPIC  XI  ROBOTIC REDO ILEOCOLIC RESECTION"; Possible Open Robotic Procedure (Use Only with Robotic procedure) "POSS OPEN  REDO ILEOCOLIC RESECTION"   NPO:  NPO greater than 8 hours.    Anesthesia history     Patient's history: negative.     Family's history: negative.        Review of Systems   Respiratory:  Negative.    Cardiovascular:  Negative.    Vascular:  Negative.    Gastrointestinal:  Negative.    Endocrine:  Negative.    Neurologic:  Negative.       Health Status   Allergies:    Allergic Reactions (Selected)  Severe  Metoprolol Succinate ER- Itching and rash.  Mild  Alka-Seltzer Plus Cold- Itching.,       (Active and Proposed Allergies Only)  Metoprolol Succinate ER   (Severity: Severe, Onset: Unknown)   Reactions: Itching, rash   Comments: Pt states he "itches on the inside."  Alka-Seltzer Plus Cold   (Severity: Mild, Onset: Unknown)   Reactions: Itching     Current medications:    Home Medications (15) Active  ALPRAZolam 0.5 mg oral tablet 0.5 mg = 1 tabs, PRN, Oral, TID  amLODIPine 10 mg oral tablet 10 mg = 1 tabs, Oral, Daily  Claritin 10 mg oral tablet 10 mg = 1 tabs, Oral, Daily  dicyclomine 10 mg oral capsule 10 mg = 1 caps, PRN, Oral, QID  hydrochlorothiazide 12.5 mg oral tablet 12.5 mg = 1 tabs, Oral, Daily  Metoprolol Succinate ER 50 mg oral tablet, extended release 50 mg = 1 tabs, Oral, Daily  metroNIDAZOLE 500 mg oral tablet 500 mg = 1 tabs, Oral, q12hr  neomycin 500 mg oral tablet 500 mg = 1 tabs  olmesartan 40 mg oral tablet 40 mg = 1 tabs, Oral, Daily  omeprazole 40 mg oral delayed release capsule 40 mg = 1 caps, Oral, Daily  ondansetron 4 mg oral tablet  4 mg = 1 tabs, Oral, QID  One-A-Day 50+ , Oral, Daily  Percocet 7.5/325 oral tablet 2 tabs, PRN, Oral, q6hr  Prevalite 4 g/5.5 g oral powder for reconstitution 4 g, Oral, BID  Stelara PFS 45 mg/0.5 mL subcutaneous solution , Subcutaneous  ,    Medications (12) Active  Scheduled: (2)  ertapenem 1gm/142m NS ADM + Premix ADM sodium chloride inj....Marland KitchenMarland KitchenMarland Kitchen100 mL  1 g 100 mL, IV Piggyback, Once  sodium chloride 0.9% Inj Soln 10 mL syringe  30 mL, IV Push, q8hr  Continuous: (1)  Lactated Ringers Injection solution 1,000 mL  1,000 mL, IV, 10 mL/hr  PRN: (9)  A Patient Specific Medication  1 EA, Kit-Combo, q566m  A Patient Specific Refrigerated Medication  1 EA, Kit-Combo, q5m25m Delivery and Return Bin Access  1 EA, Kit-Combo, q5mi97mlidocaine 1% PF Inj Soln 2 mL  0.25 mL, ID,  q8mn  lidocaine 2% Topical Gel with applicator 184-16mL  1 app, Topical, q561m  Respiratory MDI Treatment  1 EA, Kit-Combo, q5m17m sodium chloride 0.9% Inj Soln 10 mL syringe  30 mL, IV Push, q5mi59msodium chloride 0.9% Inj Soln 10 mL vial PF  30 mL, IV Push, q5min79mterile water Inj Soln 10 mL  10 mL, N/A, q5min 22m Problem list:    Active Problems (7)  Arthritis   COPD, mild   Crohn disease   Difficulty voiding   Hypertension   Postoperative nausea and vomiting   Shortness of breath   ,    Problems   (Active Problems Only)    Hypertensive disorder   (SNOMED CT: 1215746063016010t: --)  Crohn's disease   (SNOMED CT: 56765093235573t: --)  Arthritis   (SNOMED CT: 7278012202542t: --)  Mild chronic obstructive pulmonary disease   (SNOMED CT: 457168706237628t: --)   Comment:  NO MEDS  (LA[place], RN, JENIFER L on 03/30/19)   Difficulty passing urine   (SNOMED CT: 255419315176160t: --)  Postoperative nausea and vomiting   (Other: 809415737106t: --)  Shortness of breath   (Other: 27423, Onset: --)        Histories   Past Medical History:    No active or resolved past medical history items have been selected or recorded.   Procedure history:    PARTIAL COLECTOMY in  1983 at 31 Years.  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        Physical Examination   Vital Signs   04/10/27/18/2993E7:16ystolic Blood Pressure 150 mm967 HI    Diastolic Blood Pressure 78 mmHg    Temperature Oral 37.0 degC    Heart Rate Monitored 78 bpm    Respiratory Rate 16 br/min    SpO2 97 %    SBP/DBP Cuff Details Left arm, Automated         Vital Signs (last 24 hrs)_____  Last Charted___________  Temp Oral     37.0 degC  (JUL 13 06:49)  Resp Rate         16 br/min  (JUL 13 06:49)  SBP      H 150mmHg32mUL 13 06:49)  DBP      78 mmHg  (JUL 13 06:49)  SpO2      97 %  (JUL 13 06:49)  Weight      99.7 kg  (JUL 13 06:49)  Height      185.4 cm  (JUL 13 06:49)  BMI      29.01  (JUL 13 06:51)     Measurements from flowsheet : Measurements   04/10/2019 6:51 EDT Body Mass Index est meas 29.01 kg/m2    Body Mass Index Measured 29.01 kg/m2   04/10/2019 6:49 EDT Height/Length Measured 185.4 cm    Height/Length Estimated 185.4 cm    Patient Stated Height/Length 99 cm    Weight Measured 99.7 kg   04/09/2019 16:02 EDT  Height/Length Estimated 185.4 cm    Weight Estimated 101.36 kg    Body Mass Index Estimated 29.49 kg/m2   04/09/2019 16:00 EDT Height/Length Estimated 185.4 cm    Weight Estimated 101.36 kg    Body Mass Index Estimated 29.49 kg/m2   04/09/2019 15:58 EDT Height/Length Estimated 185.4 cm    Weight Estimated 101.36 kg    Body Mass Index Estimated 29.49 kg/m2      Pain assessment:  Pain Assessment   04/10/2019 6:49 EDT Numeric Rating Pain Scale 0 = No pain    Numeric Pain Rating Comfort Function Goal 4      .    General:          Stress: No acute distress.          Appearance: Within normal limits.    Airway:          Mallampati classification: I (soft palate, fauces, uvula, pillars visible).         Thyromental Distance: Normal.         Throat: Within normal limits.    Head:  Normocephalic.    Neck:  Full range of motion.    Respiratory:  Lungs are clear to auscultation.    Cardiovascular:  Normal rate.    Neurologic:  Alert, Oriented.       Review / Management   Results review:     No qualifying data available, Lab results: 04/10/2019 6:51 EDT       Estimated Creatinine Clearance            123.33 mL/min  .       Assessment and Plan   American Society of Anesthesiologists#(ASA) physical status classification:  Class III.    Anesthetic Preoperative Plan     Anesthetic technique: General anesthesia.     Maintenance airway: Oral endotracheal tube.     Opioid Assessment: Opioid Na????ve.     Risks discussed: nausea, vomiting, headache, sore throat, dental injury, hypotension, allergic reaction, serious complications.     Notes: Alternatives, risks of anesthesia, and potential complications were discussed. Patient and/or guardian state understanding and acceptance of anesthesia plan.Immunologist Line     Electronically Signed on 04/10/2019 07:19 AM EDT   ________________________________________________   Adaline Sill A.-MD, MD

## 2019-04-10 NOTE — Nursing Note (Signed)
Adult Patient History Form-Text       Adult Patient History Entered On:  04/10/2019 15:44 EDT    Performed On:  04/10/2019 15:43 EDT by Rogelia Mire, RN, Jason Fila               General Info   Patient Identified :   [name]   Information Given By :   Self   Accompanied By :   None   In Charge of News (ICON) Name :   Tommy Medal  [phone]   Pregnancy Status :   N/A   In Charge of News (ICON) Code :   5050   Has the patient received chemotherapy or immunotherapy (cytotoxic)  in the last 48-72 hours? :   No   In Clinical Trial With Signed Consent for Related Condition :   No signed consent for clinical trial   Is the patient currently (2-3 days) receiving radiation treatment? :   No   STOW, RN, Jason Fila - 04/10/2019 15:43 EDT   Admission Complete   Admission Complete :   Yes-unable to obtain further information   Rogelia Mire RNJason Fila - 04/10/2019 15:43 EDT

## 2019-04-10 NOTE — Nursing Note (Signed)
 Adult Patient History Form-Text       Adult Patient History Entered On:  04/10/2019 17:28 EDT    Performed On:  04/10/2019 17:25 EDT by JOELENE, RN, KRISTINE B               General Info   Patient Identified :   Identification band   Patient Identified :   [name]   Information Given By :   Self   Accompanied By :   None   In Charge of News (ICON) Name :   [name]  [id number]   Pregnancy Status :   N/A   In Charge of News (ICON) Code :   5050   Has the patient received chemotherapy or immunotherapy (cytotoxic)  in the last 48-72 hours? :   No   In Clinical Trial With Signed Consent for Related Condition :   No signed consent for clinical trial   Is the patient currently (2-3 days) receiving radiation treatment? :   No   STOW, RN, KRISTINE B - 04/10/2019 17:25 EDT   Allergies   (As Of: 04/10/2019 17:28:00 EDT)   Allergies (Active)   Alka-Seltzer Plus Cold  Estimated Onset Date:   Unspecified ; Reactions:   Itching ; Created By:   LAKE, RN, JENIFER L; Reaction Status:   Active ; Category:   Drug ; Substance:   Alka-Seltzer Plus Cold ; Type:   Allergy ; Severity:   Mild ; Updated By:   ALMOND, RN, JENIFER L; Reviewed Date:   04/10/2019 6:41 EDT      Metoprolol Succinate ER  Estimated Onset Date:   Unspecified ; Reactions:   Itching, rash ; Comments:     Comment 1: Pt states he itches on the inside.   ; Created By:   Dineen OBIE Maus; Reaction Status:   Active ; Category:   Drug ; Substance:   Metoprolol Succinate ER ; Type:   Allergy ; Severity:   Severe ; Updated By:   Dineen OBIE Maus; Reviewed Date:   04/10/2019 6:41 EDT        Medication History   Medication List   (As Of: 04/10/2019 17:28:00 EDT)   Normal Order    Lactated Ringers Injection solution 1,000 mL  :   Lactated Ringers Injection solution 1,000 mL ; Status:   Ordered ; Ordered As Mnemonic:   Lactated Ringers Injection 1,000 mL ; Simple Display Line:   75 mL/hr, IV, Stop: 04/11/19 14:34:00 EDT ; Ordering Provider:   LEIDA BRUCKNER;  Catalog Code:   Lactated Ringers Injection ; Order Dt/Tm:   04/10/2019 14:35:26 EDT          insulin (regular) 100 units/100 mL NS 100 units + premix sodium chloride 0.9%..... 100 mL  :   insulin (regular) 100 units/100 mL NS 100 units + premix sodium chloride 0.9%..... 100 mL ; Status:   Discontinued ; Ordered As Mnemonic:   insulin regular (human) IV additive 100 units + Sodium Chloride 0.9% 100 mL ; Simple Display Line:   TITRATE, IV ; Ordering Provider:   AVERSA-MD,  JEFFREY M; Catalog Code:   Sodium Chloride 0.9% ; Order Dt/Tm:   04/10/2019 11:13:01 EDT ; Comment:   Titrate per Endot[employer] software          Sodium Chloride 0.9% intravenous solution 1,000 mL  :   Sodium Chloride 0.9% intravenous solution 1,000 mL ; Status:   Discontinued ; Ordered As Mnemonic:   Sodium Chloride 0.9% 1,000 mL ;  Simple Display Line:   10 mL/hr, IV ; Ordering Provider:   AVERSA-MD,  JEFFREY M; Catalog Code:   Sodium Chloride 0.9% ; Order Dt/Tm:   04/10/2019 11:13:01 EDT ; Comment:   Carrier fluid for insulin.          ketamine in NS 100mg /157mL infusion premix 100 mg [3 mcg/kg/min] + premix sodium chloride 0.9%...SABRA.  :   ketamine in NS 100mg /164mL infusion premix 100 mg [3 mcg/kg/min] + premix sodium chloride 0.9%..... ; Status:   Ordered ; Ordered As Mnemonic:   ketamine IV additive 100 mg [3 mcg/kg/min] + Sodium Chloride 0.9% 100 mL ; Simple Display Line:   18 mL/hr, IV ; Ordering Provider:   Colona,  John A.-MD; Catalog Code:   Sodium Chloride 0.9% ; Order Dt/Tm:   04/10/2019 07:34:51 EDT ; Comment:   ** For INTRA-OP use only**  If patient requires Low Dose Ketamine Infusion, then Anesthesia will place ANES Low Dose Ketamine Infusion Order prior to patient being transferred from PACU to floor at [facility] Only.          Lactated Ringers Injection solution 1,000 mL  :   Lactated Ringers Injection solution 1,000 mL ; Status:   Ordered ; Ordered As Mnemonic:   Lactated Ringers Injection 1,000 mL ; Simple Display Line:   10 mL/hr, IV  ; Ordering Provider:   LAGARES-GARCIA-MD,  JORGE; Catalog Code:   Lactated Ringers Injection ; Order Dt/Tm:   04/10/2019 06:26:11 EDT ; Comment:   Perioperative use ONLY  For Non Dialysis Patient          alvimopan 12 mg Cap  :   alvimopan 12 mg Cap ; Status:   Ordered ; Ordered As Mnemonic:   Entereg ; Simple Display Line:   12 mg, 1 caps, Oral, BID ; Ordering Provider:   LEIDA BRUCKNER; Catalog Code:   alvimopan ; Order Dt/Tm:   04/10/2019 14:35:26 EDT ; Comment:   May be re-ordered BID for 2 additional doses as needed.  Discontinue Alvimopan if patient has bowel movement on POD+1 or later          amLODIPine 10 mg Tab  :   amLODIPine 10 mg Tab ; Status:   Ordered ; Ordered As Mnemonic:   amLODIPine ; Simple Display Line:   10 mg, 1 tabs, Oral, Daily ; Ordering Provider:   LEIDA BRUCKNER; Catalog Code:   amLODIPine ; Order Dt/Tm:   04/10/2019 10:27:36 EDT          hydroCHLOROthiazide 12.5 mg Cap  :   hydroCHLOROthiazide 12.5 mg Cap ; Status:   Ordered ; Ordered As Mnemonic:   hydrochlorothiazide ; Simple Display Line:   12.5 mg, 1 caps, Oral, Daily ; Ordering Provider:   LEIDA BRUCKNER; Catalog Code:   hydrochlorothiazide ; Order Dt/Tm:   04/10/2019 10:27:48 EDT          loratadine 10 mg Tab  :   loratadine 10 mg Tab ; Status:   Ordered ; Ordered As Mnemonic:   Claritin ; Simple Display Line:   10 mg, 1 tabs, Oral, Daily ; Ordering Provider:   LEIDA BRUCKNER; Catalog Code:   loratadine ; Order Dt/Tm:   04/10/2019 10:27:52 EDT          metoprolol succinate 50 mg ER Tab  :   metoprolol succinate 50 mg ER Tab ; Status:   Ordered ; Ordered As Mnemonic:   Metoprolol Succinate ER ; Simple Display Line:  50 mg, 1 tabs, Oral, Daily ; Ordering Provider:   LEIDA BRUCKNER; Catalog Code:   metoprolol ; Order Dt/Tm:   04/10/2019 10:28:02 EDT          olmesartan 20 mg Tab  :   olmesartan 20 mg Tab ; Status:   Ordered ; Ordered As Mnemonic:   olmesartan ; Simple Display Line:    40 mg, 2 tabs, Oral, Daily ; Ordering Provider:   LEIDA BRUCKNER; Catalog Code:   olmesartan ; Order Dt/Tm:   04/10/2019 10:28:19 EDT          heparin 5000 units/mL Inj Soln 1 mL  :   heparin 5000 units/mL Inj Soln 1 mL ; Status:   Ordered ; Ordered As Mnemonic:   heparin ; Simple Display Line:   5,000 units, 1 mL, Subcutaneous, q12hr ; Ordering Provider:   ROBINSON-PA,  CHRISTOPHER; Catalog Code:   heparin ; Order Dt/Tm:   04/10/2019 14:35:26 EDT ; Comment:   HIGH ALERT MEDICATION - USE CAUTION          insulin aspart 100 units/mL Subcut Soln 10 mL  :   insulin aspart 100 units/mL Subcut Soln 10 mL ; Status:   Ordered ; Ordered As Mnemonic:   insulin aspart 100 units/mL correctional range dose ; Simple Display Line:   5 units, 0.05 mL, Subcutaneous, q6hr ; Ordering Provider:   AVERSA-MD,  JEFFREY M; Catalog Code:   insulin aspart ; Order Dt/Tm:   04/10/2019 17:04:50 EDT ; Comment:   BG =150 - 200: 1 unit SQ,   BG= 201 - 250: 2 units SQ,   BG= 251 - 300: 3 units SQ,   BG= 301 - 350: 4 units SQ,   BG greater than/equal to 351: 5 units SQ    For patients with two consecutive BGs > 180 and without a basal and bolus/mealtime  insulin, contact the MD and recommend addition of basal and bolus/mealtime insulin          Dextrose 10% in Water intravenous solution  :   Dextrose 10% in Water intravenous solution ; Status:   Ordered ; Ordered As Mnemonic:   Dextrose 10% Intermittent ; Simple Display Line:   1,000 mL, 40 mL/hr, IV Piggyback, q24hr, PRN: other (see comment) ; Ordering Provider:   IRINE REYES HERO; Catalog Code:   Dextrose 10% in Water ; Order Dt/Tm:   04/10/2019 17:04:49 EDT ; Comment:   If patient is receiving basal insulin and tube feeds/TPN held for > 15 min, infuse at the same rate as tube feed or TPN          Dextrose 50% IV SYR 50 mL  :   Dextrose 50% IV SYR 50 mL ; Status:   Ordered ; Ordered As Mnemonic:   Dextrose 50% Syringe ; Simple Display Line:   25 g, 50 mL, IV Push, q15min, PRN: low  blood sugar ; Ordering Provider:   AVERSA-MD,  JEFFREY M; Catalog Code:   Dextrose 50% in Water ; Order Dt/Tm:   04/10/2019 17:04:50 EDT ; Comment:   may repeat x 1 for BG less than 70; use in unconcious patient or conscious patient that is NPO with IV access          glucagon recombinant 1 mg Inj  :   glucagon recombinant 1 mg Inj ; Status:   Ordered ; Ordered As Mnemonic:   glucagon ; Simple Display Line:   1 mg, 1 EA, IM, Once,  PRN: low blood sugar ; Ordering Provider:   AVERSA-MD,  JEFFREY M; Catalog Code:   glucagon ; Order Dt/Tm:   04/10/2019 17:04:50 EDT ; Comment:   If IV access CANNOT be obtained quickly for unconscious patient or conscious patient that is NPO with blood glucose less than 70          glucose 4 g Chew Tab  :   glucose 4 g Chew Tab ; Status:   Ordered ; Ordered As Mnemonic:   glucose ; Simple Display Line:   12 g, 3 tabs, Oral, q15min, PRN: low blood sugar ; Ordering Provider:   AVERSA-MD,  JEFFREY M; Catalog Code:   glucose ; Order Dt/Tm:   04/10/2019 17:04:50 EDT ; Comment:   for BG less than 70 repeating juice or glucose tabs until BG greater than 70 x 2  Juice = 4 ounces of apple, cranberry or grape juice. If Conscious and able to take oral medicaitons.          Glucose Management Oral Supplement  :   Glucose Management Oral Supplement ; Status:   Ordered ; Ordered As Mnemonic:   Nutrition Supplement - Ensure High Protein (High Protein, Reduced Carbohydrates) Oral Supplement... ; Simple Display Line:   1 units, Oral, TIDWM ; Ordering Provider:   LEIDA BRUCKNER; Catalog Code:   Glucose Management Oral Supplement ; Order Dt/Tm:   04/10/2019 14:35:26 EDT          gabapentin 100 mg Cap  :   gabapentin 100 mg Cap ; Status:   Ordered ; Ordered As Mnemonic:   Neurontin ; Simple Display Line:   100 mg, 1 caps, Oral, q8hr ; Ordering Provider:   ROBINSON-PA,  CHRISTOPHER; Catalog Code:   gabapentin ; Order Dt/Tm:   04/10/2019 14:35:26 EDT ; Comment:   Use caution in patient > 97 years old           diphenhydrAMINE 25 mg Cap  :   diphenhydrAMINE 25 mg Cap ; Status:   Ordered ; Ordered As Mnemonic:   diphenhydrAMINE ; Simple Display Line:   25 mg, 1 caps, Oral, q6hr, PRN: itching/allergic reaction ; Ordering Provider:   ROBINSON-PA,  CHRISTOPHER; Catalog Code:   diphenhydrAMINE ; Order Dt/Tm:   04/10/2019 14:35:26 EDT          HYDROmorphone 2 mg/mL Inj Soln 1 mL  :   HYDROmorphone 2 mg/mL Inj Soln 1 mL ; Status:   Ordered ; Ordered As Mnemonic:   Dilaudid ; Simple Display Line:   1 mg, 0.5 mL, IV Push, q4hr-INT, PRN: severe pain (8-10) ; Ordering Provider:   LEIDA BRUCKNER; Catalog Code:   HYDROmorphone ; Order Dt/Tm:   04/10/2019 14:35:26 EDT ; Comment:   THIS MEDICATION IS ASSOCIATED   WITH   AN INCREASED RISK OF FALLS.          ondansetron 2 mg/mL Inj Soln 2 mL  :   ondansetron 2 mg/mL Inj Soln 2 mL ; Status:   Ordered ; Ordered As Mnemonic:   ondansetron ; Simple Display Line:   4 mg, 2 mL, IV Push, q6hr, PRN: nausea/vomiting ; Ordering Provider:   LEIDA BRUCKNER; Catalog Code:   ondansetron ; Order Dt/Tm:   04/10/2019 14:35:26 EDT ; Comment:   Give Zofran first if both Zofran and Phenergan ordered          oxyCODONE 5 mg Tab  :   oxyCODONE 5 mg Tab ; Status:   Ordered ;  Ordered As Mnemonic:   oxyCODONE ; Simple Display Line:   10 mg, 2 tabs, Oral, q4hr-INT, PRN: moderate pain (4-7) ; Ordering Provider:   LEIDA BRUCKNER; Catalog Code:   oxyCODONE ; Order Dt/Tm:   04/10/2019 14:35:26 EDT ; Comment:    IMMEDIATE RELEASE            promethazine 25 mg/mL Inj Soln 1 mL  :   promethazine 25 mg/mL Inj Soln 1 mL ; Status:   Ordered ; Ordered As Mnemonic:   promethazine IV range dose - High Alert ; Simple Display Line:   25 mg, 1 mL, IV Push, q6hr, PRN: nausea/vomiting ; Ordering Provider:   LEIDA BRUCKNER; Catalog Code:   promethazine ; Order Dt/Tm:   04/10/2019 14:35:26 EDT ; Comment:   Use ondansetron FIRST    Please dilute promethazine 25mg / 1 mL with 9 mL of normal  saline for a total of 10 mL  (final concentration equals 2.5 mg / 1 mL) and push dose over 1 minute          sodium chloride 0.9% Inj Soln 10 mL syringe  :   sodium chloride 0.9% Inj Soln 10 mL syringe ; Status:   Ordered ; Ordered As Mnemonic:   sodium chloride 0.9% flush syringe range dose ; Simple Display Line:   30 mL, IV Push, q8hr ; Ordering Provider:   LAGARES-GARCIA-MD,  JORGE; Catalog Code:   sodium chloride flush ; Order Dt/Tm:   04/10/2019 93:78:78 EDT          hydrALAZINE 20 mg/mL Inj Soln 1 mL  :   hydrALAZINE 20 mg/mL Inj Soln 1 mL ; Status:   Completed ; Ordered As Mnemonic:   hydrALAZINE ; Simple Display Line:   10 mg, 0.5 mL, IV Push, Once ; Ordering Provider:   Leonel Rush A.-MD; Catalog Code:   hydrALAZINE ; Order Dt/Tm:   04/10/2019 87:73:57 EDT ; Comment:   SOUND ALIKE / LOOK ALIKE - VERIFY DRUG          New Consult - Check Clinical Pharmacy MPTL  :   New Consult - Check Clinical Pharmacy MPTL ; Status:   Ordered ; Ordered As Mnemonic:   New Consult - Check Clinical Pharmacy MPTL ; Simple Display Line:   1 EA, N/A, On Call ; Ordering Provider:   SYSTEM,  SYSTEM; Catalog Code:   New Consult - Check Clinical Pharmacy MP ; Order Dt/Tm:   04/10/2019 11:13:33 EDT ; Comment:   New consult for Daily Pharmacy Consult - Miscellaneous. Pharmacist should review in the Clinical Pharmacy MPTL.      As soon as appropriate, pharmacy should discontinue or void this order to remove it from the [facility].          Dextrose 10% in Water intravenous solution  :   Dextrose 10% in Water intravenous solution ; Status:   Discontinued ; Ordered As Mnemonic:   Dextrose 10% Intermittent ; Simple Display Line:   1,000 mL, 30 mL/hr, IV Piggyback, q24hr, PRN: other (see comment) ; Ordering Provider:   LEIDA BRUCKNER; Catalog Code:   Dextrose 10% in Water ; Order Dt/Tm:   04/10/2019 11:13:01 EDT ; Comment:   Same rate as TPN or TF as needed for primary nutrition on hold          Dextrose 50% IV SYR 50 mL  :   Dextrose 50%  IV SYR 50 mL ; Status:   Discontinued ; Ordered As  Mnemonic:   Dextrose 50% Syringe ; Simple Display Line:   25 g, 50 mL, IV Push, Once, PRN: other (see comment) ; Ordering Provider:   LEIDA BRUCKNER; Catalog Code:   Dextrose 50% in Water ; Order Dt/Tm:   04/10/2019 11:13:01 EDT ; Comment:   PRN hypoglycemic .As directed per EndoTool software          HYDROmorphone 2 mg/mL Inj Soln 1 mL  :   HYDROmorphone 2 mg/mL Inj Soln 1 mL ; Status:   Discontinued ; Ordered As Mnemonic:   HYDROmorphone range dose ; Simple Display Line:   0.5 mg, 0.25 mL, IV Push, q10min, PRN: other (see comment) ; Ordering Provider:   Leonel Rush A.-MD; Catalog Code:   HYDROmorphone ; Order Dt/Tm:   04/10/2019 10:55:56 EDT ; Comment:   For Pain  Max dose = 2mg  For pain unrelieved by primary choice          labetalol 5 mg/mL IV Soln 4 mL  :   labetalol 5 mg/mL IV Soln 4 mL ; Status:   Discontinued ; Ordered As Mnemonic:   labetalol ; Simple Display Line:   5 mg, 1 mL, IV Push, q10min, PRN: other (see comment) ; Ordering Provider:   Leonel Rush A.-MD; Catalog Code:   labetalol ; Order Dt/Tm:   04/10/2019 10:55:57 EDT ; Comment:   Give for SBP greater than 160  Hold for HR less than  60.    Max dose 20 mg.          morphine 4 mg/mL preservative-free Sol 1 mL  :   morphine 4 mg/mL preservative-free Sol 1 mL ; Status:   Discontinued ; Ordered As Mnemonic:   morphine range dose ; Simple Display Line:   5 mg, 1.25 mL, IV Push, q5min, PRN: other (see comment) ; Ordering Provider:   Leonel Rush A.-MD; Catalog Code:   morphine ; Order Dt/Tm:   04/10/2019 10:55:56 EDT ; Comment:   For Pain  Max dose = 10 mg Primary choice          ondansetron 2 mg/mL Inj Soln 2 mL  :   ondansetron 2 mg/mL Inj Soln 2 mL ; Status:   Completed ; Ordered As Mnemonic:   ondansetron ; Simple Display Line:   4 mg, 2 mL, IV Push, Once, PRN: nausea/vomiting ; Ordering Provider:   Leonel Rush A.-MD; Catalog Code:   ondansetron ; Order Dt/Tm:   04/10/2019 10:55:56 EDT ;  Comment:   Primary choice          ALPRAZolam  0.5 mg Tab  :   ALPRAZolam  0.5 mg Tab ; Status:   Ordered ; Ordered As Mnemonic:   ALPRAZolam  ; Simple Display Line:   0.5 mg, 1 tabs, Oral, TID, PRN: anxiety ; Ordering Provider:   ROBINSON-PA,  CHRISTOPHER; Catalog Code:   ALPRAZolam  ; Order Dt/Tm:   04/10/2019 10:27:35 EDT          Ketamine --infusion Pyxis Access  :   Ketamine --infusion Pyxis Access ; Status:   Ordered ; Ordered As Mnemonic:   Ketamine infusion access ; Simple Display Line:   1 EA, N/A, Daily, PRN: other (see comment) ; Ordering Provider:   Leonel Rush A.-MD; Catalog Code:   Ketamine infusion access ; Order Dt/Tm:   04/10/2019 07:34:52 EDT ; Comment:   This is to generate a line item on the Automated dispensing machine (ADM), Pyxis medstation to access the ketamine infusion.  alvimopan 12 mg Cap  :   alvimopan 12 mg Cap ; Status:   Completed ; Ordered As Mnemonic:   alvimopan ; Simple Display Line:   12 mg, 1 caps, Oral, On Call ; Ordering Provider:   LAGARES-GARCIA-MD,  SULA; Catalog Code:   alvimopan ; Order Dt/Tm:   04/10/2019 06:26:11 EDT          ertapenem 1gm/100ml NS ADM + Premix ADM sodium chloride inj...SABRASABRASABRA 100 mL  :   ertapenem 1gm/100ml NS ADM + Premix ADM sodium chloride inj...SABRASABRASABRA 100 mL ; Status:   Completed ; Ordered As Mnemonic:   ertapenem + Sodium Chloride 0.9% 100 mL ; Simple Display Line:   1 g, 100 mL, 200 mL/hr, IV Piggyback, Once ; Ordering Provider:   LAGARES-GARCIA-MD,  JORGE; Catalog Code:   ertapenem ; Order Dt/Tm:   04/10/2019 06:26:11 EDT          gabapentin 100 mg Cap  :   gabapentin 100 mg Cap ; Status:   Completed ; Ordered As Mnemonic:   Neurontin ; Simple Display Line:   100 mg, 1 caps, Oral, On Call ; Ordering Provider:   LAGARES-GARCIA-MD,  SULA; Catalog Code:   gabapentin ; Order Dt/Tm:   04/10/2019 93:73:78 EDT ; Comment:   Use caution in patient > 18 years old          heparin 5000 units/mL Inj Soln 1 mL  :   heparin 5000 units/mL Inj Soln 1 mL ; Status:    Completed ; Ordered As Mnemonic:   heparin ; Simple Display Line:   5,000 units, 1 mL, Subcutaneous, On Call ; Ordering Provider:   EVERTT SULA; Catalog Code:   heparin ; Order Dt/Tm:   04/10/2019 06:26:11 EDT          A Patient Specific Medication  :   A Patient Specific Medication ; Status:   Ordered ; Ordered As Mnemonic:   A Patient Specific Medication ; Simple Display Line:   1 EA, Kit-Combo, q5min, PRN: other (see comment) ; Ordering Provider:   EVERTT SULA; Catalog Code:   A Patient Specific Medication ; Order Dt/Tm:   04/10/2019 93:78:78 EDT          A Patient Specific Refrigerated Medication  :   A Patient Specific Refrigerated Medication ; Status:   Ordered ; Ordered As Mnemonic:   A Patient Specific Refrigerated Medication ; Simple Display Line:   1 EA, Kit-Combo, q48min, PRN: other (see comment) ; Ordering Provider:   EVERTT SULA; Catalog Code:   A Patient Specific Refrigerated Medicati ; Order Dt/Tm:   04/10/2019 93:78:78 EDT ; Comment:   to access the patient specific Refrigerated medications          Delivery and Return [place] Access  :   Delivery and Return [place] Access ; Status:   Ordered ; Ordered As Mnemonic:   Delivery and Return [place] Access ; Simple Display Line:   1 EA, Kit-Combo, q62min, PRN: other (see comment) ; Ordering Provider:   EVERTT SULA; Catalog Code:   Delivery and Return Bin Access ; Order Dt/Tm:   04/10/2019 93:78:78 EDT ; Comment:   This code grants access to the [employer] for the Delivery and Return Bin Access          lidocaine 1% PF Inj Soln 2 mL  :   lidocaine 1% PF Inj Soln 2 mL ; Status:   Ordered ; Ordered As Mnemonic:  lidocaine 1% preservative-free injectable solution ; Simple Display Line:   0.25 mL, ID, q5min, PRN: other (see comment) ; Ordering Provider:   EVERTT FOX; Catalog Code:   lidocaine ; Order Dt/Tm:   04/10/2019 93:78:78 EDT ; Comment:   to access lidocaine 1%  2 mL vial for IV  start and Life Port access          lidocaine 2% Topical Gel with applicator 10-11 mL  :   lidocaine 2% Topical Gel with applicator 10-11 mL ; Status:   Ordered ; Ordered As Mnemonic:   Uro-Jet 2% topical gel with applicator ; Simple Display Line:   1 app, Topical, q5min, PRN: other (see comment) ; Ordering Provider:   LAGARES-GARCIA-MD,  JORGE; Catalog Code:   lidocaine topical ; Order Dt/Tm:   04/10/2019 93:78:78 EDT          Respiratory MDI Treatment  :   Respiratory MDI Treatment ; Status:   Ordered ; Ordered As Mnemonic:   Respiratory MDI Treatment ; Simple Display Line:   1 EA, Kit-Combo, q5min, PRN: other (see comment) ; Ordering Provider:   EVERTT FOX; Catalog Code:   Respiratory MDI Treatment ; Order Dt/Tm:   04/10/2019 93:78:78 EDT          sodium chloride 0.9% Inj Soln 10 mL syringe  :   sodium chloride 0.9% Inj Soln 10 mL syringe ; Status:   Ordered ; Ordered As Mnemonic:   sodium chloride 0.9% flush syringe range dose ; Simple Display Line:   30 mL, IV Push, q5min, PRN: other (see comment) ; Ordering Provider:   EVERTT FOX; Catalog Code:   sodium chloride flush ; Order Dt/Tm:   04/10/2019 93:78:78 EDT          sodium chloride 0.9% Inj Soln 10 mL vial PF  :   sodium chloride 0.9% Inj Soln 10 mL vial PF ; Status:   Ordered ; Ordered As Mnemonic:   sodium chloride 0.9% vial for reconstitution range dose ; Simple Display Line:   30 mL, IV Push, q5min, PRN: other (see comment) ; Ordering Provider:   EVERTT FOX; Catalog Code:   sodium chloride flush ; Order Dt/Tm:   04/10/2019 93:78:78 EDT ; Comment:   for access to sodium chloride 0.9% vial when needed as a diluent for reconstitutable medications          sterile water Inj Soln 10 mL  :   sterile water Inj Soln 10 mL ; Status:   Ordered ; Ordered As Mnemonic:   sterile water for reconstitution ; Simple Display Line:   10 mL, N/A, q5min, PRN: other (see comment) ; Ordering Provider:   EVERTT FOX; Catalog  Code:   sterile water for reconstitution ; Order Dt/Tm:   04/10/2019 93:78:78 EDT ; Comment:   Access sterile water when needed as a diluent for reconstitutable medications. Not for IV use.            Home Meds    metroNIDAZOLE  :   metroNIDAZOLE ; Status:   Documented ; Ordered As Mnemonic:   metroNIDAZOLE 500 mg oral tablet ; Simple Display Line:   500 mg, 1 tabs, Oral, q12hr, 20 tabs, 0 Refill(s) ; Catalog Code:   metroNIDAZOLE ; Order Dt/Tm:   04/10/2019 06:52:36 EDT          neomycin  :   neomycin ; Status:   Documented ; Ordered As Mnemonic:   neomycin 500 mg oral  tablet ; Simple Display Line:   500 mg, 1 tabs, 0 Refill(s) ; Catalog Code:   neomycin ; Order Dt/Tm:   04/10/2019 06:52:36 EDT          multivitamin  :   multivitamin ; Status:   Documented ; Ordered As Mnemonic:   One-A-Day 50+ ; Simple Display Line:   Oral, Daily, 0 Refill(s) ; Catalog Code:   multivitamin ; Order Dt/Tm:   04/10/2019 06:45:44 EDT          loratadine  :   loratadine ; Status:   Documented ; Ordered As Mnemonic:   Claritin 10 mg oral tablet ; Simple Display Line:   10 mg, 1 tabs, Oral, Daily, for 14 days, 14 tabs, 0 Refill(s) ; Catalog Code:   loratadine ; Order Dt/Tm:   04/03/2019 15:50:15 EDT          cholestyramine  :   cholestyramine ; Status:   Documented ; Ordered As Mnemonic:   Prevalite 4 g/5.5 g oral powder for reconstitution ; Simple Display Line:   4 g, Oral, BID, 60 EA, 0 Refill(s) ; Ordering Provider:   HADZIJAHIC-MD,  NEVEN; Catalog Code:   cholestyramine ; Order Dt/Tm:   03/30/2019 08:47:54 EDT          ustekinumab  :   ustekinumab ; Status:   Documented ; Ordered As Mnemonic:   Stelara PFS 45 mg/0.5 mL subcutaneous solution ; Simple Display Line:   mg, Subcutaneous, 0 Refill(s) ; Catalog Code:   ustekinumab ; Order Dt/Tm:   03/30/2019 08:45:45 EDT ; Comment:   Outpatient use only          omeprazole  :   omeprazole ; Status:   Documented ; Ordered As Mnemonic:   omeprazole 40 mg oral delayed release capsule ; Simple Display  Line:   40 mg, 1 caps, Oral, Daily, 0 Refill(s) ; Catalog Code:   omeprazole ; Order Dt/Tm:   03/30/2019 08:44:10 EDT          ondansetron  :   ondansetron ; Status:   Documented ; Ordered As Mnemonic:   ondansetron 4 mg oral tablet ; Simple Display Line:   4 mg, 1 tabs, Oral, QID, 0 Refill(s) ; Catalog Code:   ondansetron ; Order Dt/Tm:   03/30/2019 08:44:10 EDT          amLODIPine  :   amLODIPine ; Status:   Documented ; Ordered As Mnemonic:   amLODIPine 10 mg oral tablet ; Simple Display Line:   10 mg, 1 tabs, Oral, Daily, 0 Refill(s) ; Catalog Code:   amLODIPine ; Order Dt/Tm:   03/30/2019 08:44:10 EDT          hydrochlorothiazide  :   hydrochlorothiazide ; Status:   Documented ; Ordered As Mnemonic:   hydrochlorothiazide 12.5 mg oral tablet ; Simple Display Line:   12.5 mg, 1 tabs, Oral, Daily, 0 Refill(s) ; Catalog Code:   hydrochlorothiazide ; Order Dt/Tm:   03/30/2019 08:44:10 EDT          metoprolol  :   metoprolol ; Status:   Documented ; Ordered As Mnemonic:   Metoprolol Succinate ER 50 mg oral tablet, extended release ; Simple Display Line:   50 mg, 1 tabs, Oral, Daily, 30 tabs, 0 Refill(s) ; Catalog Code:   metoprolol ; Order Dt/Tm:   03/30/2019 08:44:10 EDT          olmesartan  :   olmesartan ; Status:   Documented ;  Ordered As Mnemonic:   olmesartan 40 mg oral tablet ; Simple Display Line:   40 mg, 1 tabs, Oral, Daily, 30 tabs, 0 Refill(s) ; Catalog Code:   olmesartan ; Order Dt/Tm:   03/30/2019 08:44:10 EDT          acetaminophen-oxyCODONE  :   acetaminophen-oxyCODONE ; Status:   Documented ; Ordered As Mnemonic:   Percocet 7.5/325 oral tablet ; Simple Display Line:   2 tabs, Oral, q6hr, PRN: as needed for pain, 0 Refill(s) ; Catalog Code:   acetaminophen-oxyCODONE ; Order Dt/Tm:   11/01/2018 12:03:11 EST ; Comment:   MAX DAILY DOSE OF ACETAMINOPHEN = 4000 MG          ALPRAZolam   :   ALPRAZolam  ; Status:   Documented ; Ordered As Mnemonic:   ALPRAZolam  0.5 mg oral tablet ; Simple Display Line:   0.5 mg, 1 tabs,  Oral, TID, PRN: for anxiety, 0 Refill(s) ; Catalog Code:   ALPRAZolam  ; Order Dt/Tm:   11/01/2018 12:02:29 EST          dicyclomine  :   dicyclomine ; Status:   Documented ; Ordered As Mnemonic:   dicyclomine 10 mg oral capsule ; Simple Display Line:   10 mg, 1 caps, Oral, QID, PRN: diarrhea, 40 caps, 0 Refill(s) ; Catalog Code:   dicyclomine ; Order Dt/Tm:   11/01/2018 12:03:38 EST ; Comment:    THIS MEDICATION IS ASSOCIATED   WITH   AN INCREASED RISK OF FALLS.            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 )  Name of Problem:   COPD, mild ; Recorder:   LAKE, RN, JENIFER L; Confirmation:   Confirmed ; Classification:   Patient Stated ; Code:   542831982 ; Contributor System:   PowerChart ; Last Updated:   03/30/2019 8:30 EDT ; Life Cycle Status:   Active ; Vocabulary:   SNOMED CT   ; Comments:        03/30/2019 8:30 - LAKE, RN, JENIFER L  NO MEDS      Crohn disease (SNOMED CT  :43234983 )  Name of Problem:   Crohn disease ; Recorder:   SMITH, RN, ROBIANN E; Confirmation:   Confirmed ; Classification:   Patient Stated ; Code:   43234983 ; Contributor System:   PowerChart ; Last Updated:   11/01/2018 12:05 EST ; Life Cycle Date:   11/01/2018 ; Life Cycle Status:   Active ; Vocabulary:   SNOMED CT        Difficulty voiding (SNOMED CT  :744580985 )  Name of Problem:   Difficulty voiding ; Recorder:   LAKE, RN, JENIFER L; Confirmation:   Confirmed ; Classification:   Patient Stated ; Code:   744580985 ; Contributor System:   PowerChart ; Last Updated:   03/30/2019 8:31 EDT ; Life Cycle Date:   03/30/2019 ; Life Cycle Status:    Active ; Vocabulary:  SNOMED CT        Hypertension (SNOMED CT  :8784255987 )  Name of Problem:   Hypertension ; Recorder:   CLAUDENE, RN, ROBIANN E; Confirmation:   Confirmed ; Classification:   Patient Stated ; Code:   8784255987 ; Contributor System:   PowerChart ; Last Updated:   11/01/2018 12:05 EST ; Life Cycle Date:   11/01/2018 ; Life Cycle Status:   Active ; Vocabulary:   SNOMED CT        Postoperative nausea and vomiting (IMO  :[phone] )  Name of Problem:   Postoperative nausea and vomiting ; Recorder:   SYSTEM,  SYSTEM; Confirmation:   Confirmed ; Classification:   Patient Stated ; Code:   190584 ; Last Updated:   04/10/2019 6:49 EDT ; Life Cycle Date:   04/10/2019 ; Life Cycle Status:   Active ; Vocabulary:   IMO        Shortness of breath (IMO  :72576 )  Name of Problem:   Shortness of breath ; Recorder:   SYSTEM,  SYSTEM; Confirmation:   Confirmed ; Classification:   Patient Stated ; Code:   72576 ; Last Updated:   04/10/2019 6:57 EDT ; Life Cycle Date:   04/10/2019 ; Life Cycle Status:   Active ; Vocabulary:   IMO          Diagnoses(Active)    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 ; Confirmation:   Confirmed ; Clinical Dx:   Colonic stricture ; Classification:   Medical ; Clinical Service:   Non-Specified ; Code:   ICD-10-CM ; Probability:   0 ; Diagnosis Code:   X43.300      Encounter for screening for other viral diseases  Date:   03/30/2019 ; Confirmation:   Confirmed ; Clinical Dx:   Encounter for screening for other viral diseases ; Classification:   Medical ; Clinical Service:   Non-Specified ; Code:   ICD-10-CM ; Probability:   0 ; Diagnosis Code:   Z11.59      Encounter for therapeutic drug level monitoring  Date:   03/30/2019 ; Confirmation:   Confirmed ; Clinical Dx:   Encounter for therapeutic drug level monitoring ; Classification:   Medical ; Clinical Service:   Non-Specified ; Code:   ICD-10-CM ; Probability:   0 ; Diagnosis Code:   Z51.81      Other long term (current) drug therapy  Date:   03/30/2019 ;  Confirmation:   Confirmed ; Clinical Dx:   Other long term (current) drug therapy ; Classification:   Medical ; Clinical Service:   Non-Specified ; Code:   ICD-10-CM ; Probability:   0 ; Diagnosis Code:   S20.100        Procedure History        -    Procedure History   (As Of: 04/10/2019 17:28:00 EDT)     Anesthesia Minutes:   0 ; Procedure Name:   Laparoscopic cholecystectomy ; Procedure Minutes:   0            Procedure Dt/Tm:   8016 ; Anesthesia Minutes:   0 ; Procedure Name:   PARTIAL COLECTOMY ; Procedure Minutes:   0            Procedure Dt/Tm:   04/10/2019 08:09:00 EDT ; Location:   [facility] ; Provider:   EVERTT FOX; Anesthesia Type:   General ; :   Colona,  John A.-MD; Anesthesia Minutes:   0 ;  Procedure Name:   Colectomy Right XI Robotic ; Procedure Minutes:   141 ; Comments:     04/10/2019 10:41 EDT - Letha, RN, Cherene CROME  auto-populated from documented surgical case ; Clinical Service:   Surgery            Anesthesia Minutes:   0 ; Procedure Name:   Colonoscopy ; Procedure Minutes:   0            Immunizations   Last Tetanus :   Unknown   STOW, RN, KRISTINE B - 04/10/2019 17:25 EDT   ID Risk Screen Symptoms   Recent Travel History :   No recent travel   Close Contact with COVID-19 ID :   Preadmission testing patients only   Last 14 days COVID-19 ID :   Yes - Not Detected (negative)   TB Symptom Screen :   No symptoms   C. diff Symptom/History ID :   Neither of the above   Patient Pregnant :   None of the above   MRSA/VRE Screening :   None of these apply   STOW, RN, MICKEL B - 04/10/2019 17:25 EDT   ID COVID-19 Screen   Fever OR Chills :   No   Headache :   No   New or Worsening Cough :   No   Fatigue :   No   Shortness of Breath ID :   No   Myalgia (Muscle Pain) :   No   Dyspnea :   No   Diarrhea :   Yes   Sore Throat :   No   Nausea :   No   Laryngitis :   No   Sudden Loss of Taste or Smell :   No   JOELENE, RN, MICKEL B - 04/10/2019 17:25 EDT   Bloodless Medicine   Is Blood Transfusion Acceptable  to Patient :   Yes   STOW, RN, KRISTINE B - 04/10/2019 17:25 EDT   Nutrition   MST Does Your Current Diet Include :   None   MST Have You Recently Lost Weight Without Trying? :   No   MST Weight Loss Score :   0    MST Have You Been Eating Poorly? :   No   MST Appetite Score :   0    MST Score :   0    MST Interpretation :   Not at risk   JOELENE OBIE MICKEL KATHEE - 04/10/2019 17:25 EDT   Functional   Sensory Deficits :   Other: contacts/glasses   ADLs Prior to Admission :   Independent   JOELENE OBIE MICKEL KATHEE - 04/10/2019 17:25 EDT   Social History   Social History   (As Of: 04/10/2019 17:28:00 EDT)   Tobacco:        Tobacco use: Former smoker, quit more than 30 days ago.  20 year(s).   (Last Updated: 03/30/2019 08:41:07 EDT by LAKE, RN, JENIFER L)          Alcohol:        Denies   (Last Updated: 11/01/2018 08:37:48 EST by Lynwood RN, Tawni)          Substance Use:        Current, Marijuana, Daily   Comments:  11/01/2018 8:38 - Lynwood, RN, Christina: last use per pt 10/31/18   (Last Updated: 11/01/2018 12:09:01 EST by CLAUDENE, RN, ROBIANN E)  Spiritual   Faith/Denomination :   Baptist   Do you have any religious/spiritual/cultural beliefs that could impact the way your care is provided? :   No   STOW, RN, MICKEL NOVAK - 04/10/2019 17:25 EDT   Harm Screen   Injuries/Abuse/Neglect in Household :   Denies   Feels Unsafe at Home :   No   Agency(s)/Others notified :   No   JOELENE RNMICKEL NOVAK JASMINE 04/10/2019 17:25 EDT   Advance Directive   Advance Directive :   No   STOW, RN, MICKEL NOVAK - 04/10/2019 17:25 EDT   Education   Written Language :   Isadora   Caregiver/Advocate Primary Language :   Isadora   Caregiver/Advocate Written Language :   Isadora   Primary Language :   Isadora JOELENE, RN, MICKEL NOVAK - 04/10/2019 17:25 EDT   Caregiver/Advocate Language   Patient :   Verbal explanation   Family :   Verbal explanation   STOW, RN, MICKEL NOVAK - 04/10/2019 17:25 EDT   Barriers to Learning :   None evident   STOW, RN, MICKEL NOVAK - 04/10/2019  17:25 EDT   DC Needs   Home Caregiver Name/Relationship :   [name]   Current Living Situation :   [phone]   JOELENE RN, MICKEL NOVAK - 04/10/2019 17:25 EDT   Valuables and Belongings   Valuables and Belongings   At Bedside :   Clothes, Glasses, Dentures, Cell phone, Other:   JOELENE RN, MICKEL B - 04/10/2019 17:25 EDT   Admission Complete   Admission Complete :   Yes   STOW, RN, KRISTINE B - 04/10/2019 17:25 EDT

## 2019-04-11 LAB — BASIC METABOLIC PANEL
Anion Gap: 14 mmol/L (ref 2–17)
BUN: 9 mg/dL (ref 8–23)
CO2: 21 mmol/L — ABNORMAL LOW (ref 22–29)
Calcium: 8.8 mg/dL (ref 8.8–10.2)
Chloride: 101 mmol/L (ref 98–107)
Creatinine: 0.6 mg/dL — ABNORMAL LOW (ref 0.7–1.3)
GFR African American: 120 mL/min/{1.73_m2} (ref >=90)
GFR Non-African American: 103 mL/min/{1.73_m2} (ref >=90)
Glucose: 133 mg/dL — ABNORMAL HIGH (ref 70–99)
Osmolaliy Calculated: 273 mosm/kg (ref 270–287)
Potassium: 3.8 mmol/L (ref 3.5–5.3)
Sodium: 136 mmol/L (ref 135–145)

## 2019-04-11 LAB — CBC WITH AUTO DIFFERENTIAL
Basophils %: 0.1 % (ref 0.0–2.0)
Basophils Absolute: 0 10*3/uL (ref 0.0–0.2)
Eosinophils %: 0 % (ref 0.0–7.0)
Eosinophils Absolute: 0 10*3/uL (ref 0.0–0.5)
Hematocrit: 42.2 % (ref 38.0–52.0)
Hemoglobin: 12.6 g/dL (ref 12.0–17.3)
Immature Grans (Abs): 0.1 10*3/uL
Immature Granulocytes %: 0.5 % (ref 0.1–0.6)
Lymphocytes Absolute: 1.3 10*3/uL (ref 1.0–3.2)
Lymphocytes: 8.6 % — ABNORMAL LOW (ref 15.0–45.0)
MCH: 29.9 pg (ref 27.0–34.5)
MCHC: 29.9 g/dL — ABNORMAL LOW (ref 32.0–36.0)
MCV: 100.2 fL — ABNORMAL HIGH (ref 84.0–100.0)
MPV: 10.6 fL (ref 7.2–13.2)
Monocytes %: 9.9 % (ref 4.0–12.0)
Monocytes Absolute: 1.5 10*3/uL — ABNORMAL HIGH (ref 0.3–1.0)
NRBC Absolute: 0 10*3/uL
NRBC Automated: 0 % (ref 0.0–0.2)
Neutrophils %: 80.9 % — ABNORMAL HIGH (ref 42.0–74.0)
Neutrophils Absolute: 12 10*3/uL — ABNORMAL HIGH (ref 1.6–7.3)
Platelets: 311 10*3/uL (ref 140–440)
RBC: 4.21 x10e6/mcL (ref 4.00–5.20)
RDW: 13.5 % (ref 11.0–16.0)
WBC: 14.8 10*3/uL — ABNORMAL HIGH (ref 3.8–10.6)

## 2019-04-11 LAB — MORPHOLOGY CHECK
Platelet Estimate: ADEQUATE
RBC Morphology: ABNORMAL — AB

## 2019-04-11 LAB — MAGNESIUM: Magnesium: 1.8 mg/dL (ref 1.6–2.6)

## 2019-04-11 LAB — POCT GLUCOSE
POC Glucose: 150 mg/dL — ABNORMAL HIGH (ref 70.0–120.0)
POC Glucose: 126 mg/dL — ABNORMAL HIGH (ref 70.0–120.0)
POC Glucose: 130 mg/dL — ABNORMAL HIGH (ref 70.0–120.0)

## 2019-04-11 LAB — C-REACTIVE PROTEIN: CRP: 3.11 mg/dL — ABNORMAL HIGH (ref 0.00–0.50)

## 2019-04-11 LAB — PHOSPHORUS: Phosphorus: 3 mg/dL (ref 2.5–4.5)

## 2019-04-12 LAB — POCT GLUCOSE
POC Glucose: 106 mg/dL (ref 70.0–120.0)
POC Glucose: 119 mg/dL (ref 70.0–120.0)
POC Glucose: 133 mg/dL — ABNORMAL HIGH (ref 70.0–120.0)
POC Glucose: 96 mg/dL (ref 70.0–120.0)

## 2019-04-12 LAB — C-REACTIVE PROTEIN: CRP: 2.48 mg/dL — ABNORMAL HIGH (ref 0.00–0.50)

## 2019-04-12 LAB — BASIC METABOLIC PANEL
Anion Gap: 9 mmol/L (ref 2–17)
BUN: 12 mg/dL (ref 8–23)
CO2: 28 mmol/L (ref 22–29)
Calcium: 8.9 mg/dL (ref 8.8–10.2)
Chloride: 103 mmol/L (ref 98–107)
Creatinine: 0.6 mg/dL — ABNORMAL LOW (ref 0.7–1.3)
GFR African American: 120 mL/min/{1.73_m2} (ref >=90)
GFR Non-African American: 103 mL/min/{1.73_m2} (ref >=90)
Glucose: 113 mg/dL — ABNORMAL HIGH (ref 70–99)
Osmolaliy Calculated: 280 mosm/kg (ref 270–287)
Potassium: 3.4 mmol/L — ABNORMAL LOW (ref 3.5–5.3)
Sodium: 140 mmol/L (ref 135–145)

## 2019-04-12 LAB — CBC
Hematocrit: 36.8 % — ABNORMAL LOW (ref 38.0–52.0)
Hemoglobin: 11.8 g/dL — ABNORMAL LOW (ref 12.0–17.3)
MCH: 30.9 pg (ref 27.0–34.5)
MCHC: 32.1 g/dL (ref 32.0–36.0)
MCV: 96.3 fL (ref 84.0–100.0)
MPV: 10.5 fL (ref 7.2–13.2)
NRBC Absolute: 0 10*3/uL
NRBC Automated: 0 % (ref 0.0–0.2)
Platelets: 287 10*3/uL (ref 140–440)
RBC: 3.82 x10e6/mcL — ABNORMAL LOW (ref 4.00–5.20)
RDW: 13.5 % (ref 11.0–16.0)
WBC: 14.6 10*3/uL — ABNORMAL HIGH (ref 3.8–10.6)

## 2019-04-12 NOTE — Progress Notes (Signed)
Chaplaincy Note - Text       Chaplaincy Note Entered On:  04/12/2019 14:41 EDT    Performed On:  04/12/2019 14:41 EDT by Glynda Jaeger               Chaplaincy Consult   Faith/Denomination :   [facility]   Additional Information, Chaplaincy :   PC attempted visit, but pt was out of his room. PC to f/u as able or requested by IDT.   Glynda Jaeger - 04/12/2019 14:41 EDT

## 2019-04-12 NOTE — Case Communication (Signed)
 CM D/C Planning Assessment Ongoing- Text       CM Admission Assessment Entered On:  04/12/2019 13:29 EDT    Performed On:  04/12/2019 13:25 EDT by CHERRIE LEW R               CM Admission Assessment   CM Insurance Information :   Medicare   CM Insurance Co. Name :   Medicare   [employer] supp   CM Prescription Coverage :   Medicare D   CM Date and Time Inpatient Order :   04/10/2019 6:26 EDT   CM Date and Time Admission IM :   04/10/2019 6:26 EDT   CM Patient has PCP :   No   CM Legal Guardian/POA :   Reneee wife [phone], [phone]   CM Patient Admitted From :   lives with wife   CM Veteran :   No   CM Initial Tentative Discharge Plan :   home with wife   CM Alternate Discharge Plan :   HH   CHERRIE LEW SAUNDERS - 04/12/2019 13:25 EDT   CM Progress Note :   04/11/2019 colectomy; ketamine; lives with wife; no PCP ; per pt over telephone due to COVID 19, I have always been healthy: plan home with wife at dc; no needs or services; dg  04/12/2019 NGT; ketamine; difficult pain control; CM followng; plan home no needs or services; dg     GRAF,  DEBRA R - 04/12/2019 13:29 EDT     CM Admission Assessment Complete :   Yes   GRAF,  DEBRA R - 04/12/2019 13:25 EDT   D/C Transportation Recommendations   D/CTransportation Recommendations :   No stretcher   GRAF,  DEBRA R - 04/12/2019 13:25 EDT

## 2019-04-13 LAB — BASIC METABOLIC PANEL
Anion Gap: 12 mmol/L (ref 2–17)
BUN: 17 mg/dL (ref 8–23)
CO2: 29 mmol/L (ref 22–29)
Calcium: 9 mg/dL (ref 8.8–10.2)
Chloride: 100 mmol/L (ref 98–107)
Creatinine: 0.6 mg/dL — ABNORMAL LOW (ref 0.7–1.3)
GFR African American: 120 mL/min/{1.73_m2} (ref >=90)
GFR Non-African American: 103 mL/min/{1.73_m2} (ref >=90)
Glucose: 111 mg/dL — ABNORMAL HIGH (ref 70–99)
Osmolaliy Calculated: 284 mosm/kg (ref 270–287)
Potassium: 3.2 mmol/L — ABNORMAL LOW (ref 3.5–5.3)
Sodium: 141 mmol/L (ref 135–145)

## 2019-04-13 LAB — CBC
Hematocrit: 35.8 % — ABNORMAL LOW (ref 38.0–52.0)
Hemoglobin: 11.9 g/dL — ABNORMAL LOW (ref 12.0–17.3)
MCH: 30.7 pg (ref 27.0–34.5)
MCHC: 33.2 g/dL (ref 32.0–36.0)
MCV: 92.5 fL (ref 84.0–100.0)
MPV: 10.5 fL (ref 7.2–13.2)
NRBC Absolute: 0 10*3/uL
NRBC Automated: 0 % (ref 0.0–0.2)
Platelets: 302 10*3/uL (ref 140–440)
RBC: 3.87 x10e6/mcL — ABNORMAL LOW (ref 4.00–5.20)
RDW: 13.1 % (ref 11.0–16.0)
WBC: 12.2 10*3/uL — ABNORMAL HIGH (ref 3.8–10.6)

## 2019-04-13 LAB — POCT GLUCOSE
POC Glucose: 99 mg/dL (ref 70.0–120.0)
POC Glucose: 126 mg/dL — ABNORMAL HIGH (ref 70.0–120.0)
POC Glucose: 117 mg/dL (ref 70.0–120.0)
POC Glucose: 92 mg/dL (ref 70.0–120.0)

## 2019-04-13 LAB — C-REACTIVE PROTEIN: CRP: 1.78 mg/dL — ABNORMAL HIGH (ref 0.00–0.50)

## 2019-04-13 LAB — MAGNESIUM: Magnesium: 2.3 mg/dL (ref 1.6–2.6)

## 2019-04-13 NOTE — Case Communication (Signed)
 CM Discharge Planning Assessment - Text       CM Progress Note Entered On:  04/13/2019 10:02 EDT    Performed On:  04/13/2019 10:01 EDT by GRAF,  DEBRA R               CM Progress Note   CM Initial Tentative Discharge Plan :   home with wife   CM Alternate Discharge Plan :   [facility]   CM Progress Note :   04/11/2019 colectomy; ketamine; lives with wife; no PCP ; per pt over telephone due to COVID 19, I have always been healthy: plan home with wife at dc; no needs or services; dg  04/12/2019 NGT; ketamine; difficult pain control; CM followng; plan home no needs or services; dg    04/13/2019 NGT removed; pending bowel function; plan no needs or services; difficult pain control; dg     GRAF,  DEBRA R - 04/13/2019 10:01 EDT

## 2019-04-14 LAB — C-REACTIVE PROTEIN: CRP: 1.56 mg/dL — ABNORMAL HIGH (ref 0.00–0.50)

## 2019-04-14 LAB — POCT GLUCOSE
POC Glucose: 99 mg/dL (ref 70.0–120.0)
POC Glucose: 114 mg/dL (ref 70.0–120.0)

## 2019-04-14 LAB — POTASSIUM: Potassium: 3.4 mmol/L — ABNORMAL LOW (ref 3.5–5.3)

## 2019-04-14 NOTE — Nursing Note (Signed)
Nursing Discharge Summary - Text       Nursing Discharge Summary Entered On:  04/14/2019 14:23 EDT    Performed On:  04/14/2019 14:23 EDT by Meta Hatchet, RN, [place]   Discharge To :   Home independently   Mode of Discharge :   Wheelchair   Transportation :   Private vehicle   [place], [place], [place] K - 04/14/2019 14:23 EDT   Education   Responsible Learner(s) :   Home Caregiver Name/Relationship: renee/ spouse        Performed by: Thurnell Garbe, EMILY E - 04/13/2019 12:14  Home Caregiver Phone Number: [phone]        Performed by: Sharlotte Alamo - 04/10/2019 17:25     Home Caregiver Present for Session :   Yes   Barriers To Learning :   None evident   Teaching Method :   Demonstration, Explanation   Meta Hatchet RN, Jackqulyn Livings - 04/14/2019 14:23 EDT   Post-Hospital Education Adult Grid   Activity Expectations :   Verbalizes understanding   Importance of Follow-Up Visits :   Verbalizes understanding   Pain Management :   Verbalizes understanding   Physical Limitations :   Verbalizes understanding   Plan of Care :   [employer] understanding   Lina Sayre - 04/14/2019 14:23 EDT   Medication Education Adult Grid   Drug to Drug Interactions :   Banker, Medication :   Verbalizes understanding   Meta Hatchet, RN, Jackqulyn Livings - 04/14/2019 14:23 EDT   Time Spent Educating Patient :   30 minutes   Meta Hatchet RN, STEPHANIE K - 04/14/2019 14:23 EDT

## 2019-04-14 NOTE — Case Communication (Signed)
 CM Discharge Planning Assessment - Text       CM Discharge Plan Entered On:  04/14/2019 10:37 EDT    Performed On:  04/14/2019 10:36 EDT by CHERRIE ADRIEN SAUNDERS               CM Discharge Plan   Discharge To :   Home independently   CM Date/Time Discharge IM :   04/14/2019 10:30 EDT   CM Progress Note :   04/11/2019 colectomy; ketamine; lives with wife; no PCP ; per pt over telephone due to COVID 19, I have always been healthy: plan home with wife at dc; no needs or services; dg  04/12/2019 NGT; ketamine; difficult pain control; CM followng; plan home no needs or services; dg    04/13/2019 NGT removed; pending bowel function; plan no needs or services; difficult pain control; dg    04/14/2019 home today ; no needs or services; family to transport; dg     Discharge Planning Assessment Complete :   Yes   GRAF,  DEBRA R - 04/14/2019 10:36 EDT   D/C Transportation Recommendations   D/CTransportation Recommendations :   No stretcher   GRAF,  DEBRA R - 04/14/2019 10:36 EDT

## 2019-04-27 LAB — COVID-19: CORONAVIRUS (COVID-19), NAA: NOT DETECTED

## 2019-08-04 LAB — CBC WITH AUTO DIFFERENTIAL
Absolute Baso #: 0.1 10*3/uL (ref 0.0–0.2)
Absolute Eos #: 0.3 10*3/uL (ref 0.0–0.5)
Absolute Lymph #: 1.8 10*3/uL (ref 1.0–3.2)
Absolute Mono #: 0.8 10*3/uL (ref 0.3–1.0)
Basophils %: 0.6 % (ref 0.0–2.0)
Eosinophils %: 2.6 % (ref 0.0–7.0)
Hematocrit: 39.2 % (ref 38.0–52.0)
Hemoglobin: 13.1 g/dL (ref 13.0–17.3)
Immature Grans (Abs): 0.04 10*3/uL (ref 0.00–0.06)
Immature Granulocytes: 0.4 % (ref 0.1–0.6)
Lymphocytes: 19.4 % (ref 15.0–45.0)
MCH: 31.2 pg (ref 27.0–34.5)
MCHC: 33.4 g/dL (ref 32.0–36.0)
MCV: 93.3 fL (ref 84.0–100.0)
MPV: 10.5 fL (ref 7.2–13.2)
Monocytes: 8.1 % (ref 4.0–12.0)
NRBC Absolute: 0 10*3/uL (ref 0.000–0.012)
NRBC Automated: 0 % (ref 0.0–0.2)
Neutrophils %: 68.9 % (ref 42.0–74.0)
Neutrophils Absolute: 6.5 10*3/uL (ref 1.6–7.3)
Platelets: 397 10*3/uL (ref 140–440)
RBC: 4.2 x10e6/mcL (ref 4.00–5.60)
RDW: 13 % (ref 11.0–16.0)
WBC: 9.5 10*3/uL (ref 3.8–10.6)

## 2019-08-04 LAB — LIPID PANEL
Chol/HDL Ratio: 3 (ref 0.0–4.4)
Cholesterol: 139 mg/dL (ref 100–200)
HDL: 46 mg/dL — ABNORMAL LOW (ref 55–72)
LDL Cholesterol: 34 mg/dL (ref 0.0–100.0)
LDL/HDL Ratio: 0.7
Triglycerides: 294 mg/dL — ABNORMAL HIGH (ref 0–149)
VLDL: 58.8 mg/dL — ABNORMAL HIGH (ref 5.0–40.0)

## 2019-08-04 LAB — COMPREHENSIVE METABOLIC PANEL
ALT: 28 U/L (ref 0–41)
AST: 25 U/L (ref 0–40)
Albumin/Globulin Ratio: 1.7 mmol/L (ref 1.00–2.00)
Albumin: 4.5 g/dL (ref 3.5–5.2)
Alk Phosphatase: 87 U/L (ref 40–130)
Anion Gap: 14 mmol/L (ref 2–17)
BUN: 13 mg/dL (ref 8–23)
CO2: 25 mmol/L (ref 22–29)
Calcium: 9.4 mg/dL (ref 8.8–10.2)
Chloride: 103 mmol/L (ref 98–107)
Creatinine: 0.8 mg/dL (ref 0.7–1.3)
GFR African American: 106 mL/min/{1.73_m2} (ref >=90)
GFR Non-African American: 92 mL/min/{1.73_m2} (ref >=90)
Globulin: 3 g/dL (ref 1.9–4.4)
Glucose: 102 mg/dL — ABNORMAL HIGH (ref 70–99)
OSMOLALITY CALCULATED: 283 mOsm/kg (ref 270–287)
Potassium: 3.9 mmol/L (ref 3.5–5.3)
Sodium: 142 mmol/L (ref 135–145)
Total Bilirubin: 0.4 mg/dL (ref 0.00–1.20)
Total Protein: 7.1 g/dL (ref 6.4–8.3)

## 2019-08-04 LAB — TSH: TSH, 3RD GENERATION: 1.21 mcIU/mL (ref 0.358–3.740)

## 2019-08-04 LAB — PSA SCREENING: Screening PSA: 0.784 ng/mL (ref 0.000–4.000)

## 2019-12-25 LAB — POCT GLUCOSE: POC Glucose: 131 mg/dL — ABNORMAL HIGH (ref 70.0–120.0)

## 2019-12-25 NOTE — Progress Notes (Signed)
 Progress Note-Nurse               Patient started to vomit after scan.  Vitals were within normal limits, with BP elevated slightly.  Patient stated I take my blood pressure meds at 2. Also, patient states I dry heave and vomit all the time. Patient stated they were ready to leave and felt better.    Signature Line     Electronically Signed on 12/25/2019 01:59 PM EDT   ________________________________________________   Debria RN, Thersia

## 2020-03-08 LAB — BASIC METABOLIC PANEL
Anion Gap: 13 mmol/L (ref 2–17)
BUN: 16 mg/dL (ref 8–23)
CO2: 26 mmol/L (ref 22–29)
Calcium: 9.3 mg/dL (ref 8.8–10.2)
Chloride: 103 mmol/L (ref 98–107)
Creatinine: 1.1 mg/dL (ref 0.7–1.3)
GFR African American: 80 mL/min/{1.73_m2} — ABNORMAL LOW (ref >=90)
GFR Non-African American: 69 mL/min/{1.73_m2} — ABNORMAL LOW (ref >=90)
Glucose: 87 mg/dL (ref 70–99)
OSMOLALITY CALCULATED: 284 mOsm/kg (ref 270–287)
Potassium: 3.8 mmol/L (ref 3.5–5.3)
Sodium: 142 mmol/L (ref 135–145)

## 2020-03-08 LAB — CBC WITH AUTO DIFFERENTIAL
Absolute Baso #: 0.1 10*3/uL (ref 0.0–0.2)
Absolute Eos #: 0.2 10*3/uL (ref 0.0–0.5)
Absolute Lymph #: 2.4 10*3/uL (ref 1.0–3.2)
Absolute Mono #: 0.7 10*3/uL (ref 0.3–1.0)
Basophils %: 1 % (ref 0.0–2.0)
Eosinophils %: 2.8 % (ref 0.0–7.0)
Hematocrit: 37.2 % — ABNORMAL LOW (ref 38.0–52.0)
Hemoglobin: 12.6 g/dL — ABNORMAL LOW (ref 13.0–17.3)
Immature Grans (Abs): 0.02 10*3/uL (ref 0.00–0.06)
Immature Granulocytes: 0.2 % (ref 0.1–0.6)
Lymphocytes: 28 % (ref 15.0–45.0)
MCH: 29.9 pg (ref 27.0–34.5)
MCHC: 33.9 g/dL (ref 32.0–36.0)
MCV: 88.2 fL (ref 84.0–100.0)
MPV: 10.9 fL (ref 7.2–13.2)
Monocytes: 7.8 % (ref 4.0–12.0)
NRBC Absolute: 0 10*3/uL (ref 0.000–0.012)
NRBC Automated: 0 % (ref 0.0–0.2)
Neutrophils %: 60.2 % (ref 42.0–74.0)
Neutrophils Absolute: 5.2 10*3/uL (ref 1.6–7.3)
Platelets: 411 10*3/uL (ref 140–440)
RBC: 4.22 x10e6/mcL (ref 4.00–5.60)
RDW: 13.7 % (ref 11.0–16.0)
WBC: 8.7 10*3/uL (ref 3.8–10.6)

## 2020-03-08 LAB — MAGNESIUM: Magnesium: 2 mg/dL (ref 1.6–2.6)

## 2020-08-12 LAB — LIPID PANEL
Chol/HDL Ratio: 1.5 (ref 0.0–4.4)
Cholesterol: 137 mg/dL (ref 100–200)
HDL: 89 mg/dL (ref >=40)
LDL Cholesterol: 25 mg/dL (ref 0.0–100.0)
LDL/HDL Ratio: 0.3
Triglycerides: 116 mg/dL (ref 0–149)
VLDL: 23.2 mg/dL (ref 5.0–40.0)

## 2020-08-12 LAB — COMPREHENSIVE METABOLIC PANEL
ALT: 18 U/L (ref 0–41)
AST: 21 U/L (ref 0–40)
Albumin/Globulin Ratio: 1.8 mmol/L (ref 1.00–2.70)
Albumin: 4.6 g/dL (ref 3.5–5.2)
Alk Phosphatase: 95 U/L (ref 40–130)
Anion Gap: 10 mmol/L (ref 2–17)
BUN: 17 mg/dL (ref 8–23)
CO2: 28 mmol/L (ref 22–29)
Calcium: 9.7 mg/dL (ref 8.8–10.2)
Chloride: 104 mmol/L (ref 98–107)
Creatinine: 0.9 mg/dL (ref 0.7–1.3)
GFR African American: 101 mL/min/{1.73_m2} (ref >=90)
GFR Non-African American: 87 mL/min/{1.73_m2} — ABNORMAL LOW (ref >=90)
Globulin: 3 g/dL (ref 1.9–4.4)
Glucose: 90 mg/dL (ref 70–99)
OSMOLALITY CALCULATED: 284 mOsm/kg (ref 270–287)
Potassium: 4.9 mmol/L (ref 3.5–5.3)
Sodium: 142 mmol/L (ref 135–145)
Total Bilirubin: 0.5 mg/dL (ref 0.00–1.20)
Total Protein: 7.1 g/dL (ref 6.4–8.3)

## 2020-08-12 LAB — PSA SCREENING: Screening PSA: 1.19 ng/mL (ref 0.000–4.000)

## 2020-08-12 LAB — TSH: TSH, 3RD GENERATION: 1.32 mcIU/mL (ref 0.358–3.740)

## 2020-08-12 LAB — COVID-19 ANTIBODY, SPIKE PROTEIN: SARS CoV2 Ab S Interp: NONREACTIVE

## 2021-08-19 ENCOUNTER — Encounter: Payer: MEDICARE | Attending: Internal Medicine | Primary: Internal Medicine

## 2021-08-19 DIAGNOSIS — Z0000 Encounter for general adult medical examination without abnormal findings: Secondary | ICD-10-CM

## 2021-08-25 ENCOUNTER — Telehealth

## 2021-08-25 MED ORDER — METHYLPREDNISOLONE 4 MG PO TBPK
4 MG | ORAL_TABLET | ORAL | 0 refills | Status: AC
Start: 2021-08-25 — End: 2021-08-31

## 2021-08-25 MED ORDER — CYCLOBENZAPRINE HCL 10 MG PO TABS
10 MG | ORAL_TABLET | ORAL | 0 refills | Status: DC
Start: 2021-08-25 — End: 2022-01-12

## 2021-08-25 NOTE — Telephone Encounter (Signed)
Rx sent

## 2021-08-25 NOTE — Telephone Encounter (Signed)
Unable to notify pt - vmbox not set up - please send in meds

## 2021-08-25 NOTE — Telephone Encounter (Signed)
Probably should not take ibuprofen. Call in Medrol Dosepak 4 mg as directed and Flexeril 10 mg one half tablet in the morning and 1 tablet at night as needed. #20. Let him know

## 2021-08-25 NOTE — Telephone Encounter (Signed)
Spoke to the patient wife who states that the patient may have pulled a muscle while putting up the christmas lights and would like to have a prescription for Ibuprofen called in  [id number]     Sent to [facility]

## 2021-10-13 ENCOUNTER — Encounter

## 2021-10-13 MED ORDER — ALPRAZOLAM 0.5 MG PO TABS
0.5 MG | ORAL_TABLET | ORAL | 3 refills | Status: AC
Start: 2021-10-13 — End: 2021-10-14

## 2021-10-13 MED ORDER — ZOLPIDEM TARTRATE 10 MG PO TABS
10 MG | ORAL_TABLET | ORAL | 5 refills | Status: AC
Start: 2021-10-13 — End: 2021-11-12

## 2021-10-13 NOTE — Telephone Encounter (Signed)
I sent in his prescriptions. Let him know

## 2021-12-04 NOTE — Telephone Encounter (Signed)
Patient's wife called requesting an appointment with Dr. Ilsa Iha    Please advise       Call back ph# [phone]

## 2021-12-05 ENCOUNTER — Telehealth
Admit: 2021-12-05 | Discharge: 2021-12-05 | Payer: MEDICARE | Attending: Physician Assistant | Primary: Internal Medicine

## 2021-12-05 DIAGNOSIS — R21 Rash and other nonspecific skin eruption: Secondary | ICD-10-CM

## 2021-12-05 MED ORDER — HYDROXYZINE HCL 25 MG PO TABS
25 MG | ORAL_TABLET | Freq: Three times a day (TID) | ORAL | 0 refills | Status: AC | PRN
Start: 2021-12-05 — End: 2021-12-15

## 2021-12-05 MED ORDER — FLUCONAZOLE 150 MG PO TABS
150 MG | ORAL_TABLET | Freq: Every day | ORAL | 0 refills | Status: AC
Start: 2021-12-05 — End: 2021-12-12

## 2021-12-05 NOTE — Progress Notes (Signed)
[name] (DOB:  1950-11-01) is a Established patient, here for evaluation of the following:    Assessment & Plan   Below is the assessment and plan developed based on review of pertinent history, physical exam, labs, studies, and medications.  1. Rash  History is suggestive of possible allergic reaction due to new detergent around the time of symptom onset, though pictures that his wife sent appear to be fungal.  Will treat for fungal infection and send hydroxyzine PRN for itching.  If no improvement, would like to see him in person to evaluate further.    Return if symptoms worsen or fail to improve.       Subjective   HPI  Patient presents today for rash.  He has had this rash for 6-7 weeks.  Rash is pruritic.  Feels it has gotten worse and spread.  He feels they do move around.  He has taken benadryl which seems to help.  He had been working on his house - using sander, paints, etc.  They used a different detergent about 76m ago, then rash started shortly thereafter.   Started with his hands and then spread.  They changed back to his old detergent a week ago - no change in symptoms.  He has tried zyrtec and benadryl.  Has not used any topicals.  He states they start as raised whelts and then after he takes antihsitamine they go down and then they have more of a ring with scale around it.  He notices it more in the areas where he sweats.      He had a recent GI flare but states this has since resolved.  He stopped Stelara.  He will be seeing GI for colonoscopy 3/29.      Denies fever, CP, SOB, edema, changes in bowel/bladder habits.  No other questions or concerns.  Review of Systems   See HPI    Objective   Patient-Reported Vitals  No data recorded     Physical Exam  [INSTRUCTIONS:  "[x] " Indicates a positive item  "[] " Indicates a negative item  -- DELETE ALL ITEMS NOT EXAMINED]    Constitutional: [x]  Appears well-developed and well-nourished [x]  No apparent distress      []  Abnormal -     Mental  status: [x]  Alert and awake  [x]  Oriented to person/place/time [x]  Able to follow commands    []  Abnormal -     Eyes:   EOM    [x]   Normal    []  Abnormal -   Sclera  [x]   Normal    []  Abnormal -          Discharge [x]   None visible   []  Abnormal -     HENT: [x]  Normocephalic, atraumatic  []  Abnormal -   [x]  Mouth/Throat: Mucous membranes are moist    External Ears [x]  Normal  []  Abnormal -    Neck: [x]  No visualized mass []  Abnormal -     Pulmonary/Chest: [x]  Respiratory effort normal   [x]  No visualized signs of difficulty breathing or respiratory distress        []  Abnormal -      Musculoskeletal:   [x]  Normal gait with no signs of ataxia         [x]  Normal range of motion of neck        []  Abnormal -     Neurological:        [x]  No Facial Asymmetry (Cranial nerve 7 motor  function) (limited exam due to video visit)          [x]  No gaze palsy        []  Abnormal -          Skin:        []  No significant exanthematous lesions or discoloration noted on facial skin         [x]  Abnormal - annular red lesions with fine scaling on abdomen, arms           Psychiatric:       [x]  Normal Affect []  Abnormal -        [x]  No Hallucinations    Other pertinent observable physical exam findings:-             , was evaluated through a synchronous (real-time) audio-video encounter. The patient (or guardian if applicable) is aware that this is a billable service, which includes applicable co-pays. This Virtual Visit was conducted with patient's (and/or legal guardian's) consent. The visit was conducted pursuant to the emergency declaration under the and the , 1135 waiver authority and the and Act.  Patient identification was verified, and a caregiver was present when appropriate.   The patient was located at Home: [address]  [place] [employer] [employer]  Provider was located at [facility] (Appt Dept): [address]  [place],  [place]         --[id number], [facility]

## 2021-12-05 NOTE — Telephone Encounter (Signed)
Called [name] back and she said that [name] does not want to come into the office.  Said she could take a picture when she gets home in about an hour or so  He has a rash that he has about for about a month.  Said it's been constant for about 3 weeks.  Red, itchy, welps.  Has been taking OTC Benadryl 2-3 times a day and then recently started taking OTC Zyrtec because the Benadryl was making him sleepy.  Wants Prednisone    She also mentioned that he was having some abdomen pain, bloating, nausea and some vomitting the other.  Did finally have a bowel movement so he is feeling some better. Said he always has stomach issues

## 2021-12-05 NOTE — Telephone Encounter (Signed)
See if he can do her virtual visit with Lanora Manis in regards to the rash.

## 2021-12-05 NOTE — Telephone Encounter (Signed)
LM for patients wife to call back with more information about an appt for her husband.

## 2021-12-05 NOTE — Telephone Encounter (Signed)
Called and got him scheduled for a my chart virtual visit with Lanora Manis for now

## 2021-12-30 NOTE — Telephone Encounter (Signed)
Patient states that he was prescribed medication for hives. He doesn't remember the name of the medication but he needs a refill. He still have hives and he ran out of the medication. Please call and advise.      Sent to [facility]

## 2021-12-30 NOTE — Telephone Encounter (Signed)
Have him come in and see Lanora Manis and let me state my head and when he is here.

## 2021-12-31 ENCOUNTER — Encounter: Payer: MEDICARE | Attending: Physician Assistant | Primary: Internal Medicine

## 2021-12-31 NOTE — Telephone Encounter (Signed)
The patient states that he is stuck in traffic for the last  30 mins    States that he has crohns disease and has to use the bathroom frequently    He is requesting a call back to r/s his appt for today    Sent to [employer]

## 2021-12-31 NOTE — Telephone Encounter (Signed)
That's fine.

## 2021-12-31 NOTE — Telephone Encounter (Signed)
R/s for 4/6 1030am

## 2022-01-01 ENCOUNTER — Encounter: Payer: MEDICARE | Attending: Physician Assistant | Primary: Internal Medicine

## 2022-01-01 NOTE — Telephone Encounter (Signed)
noted 

## 2022-01-01 NOTE — Telephone Encounter (Signed)
I spoke to the patient's wife Luster Landsberg who states that the patient is not feeling well enough to come into the office today and will need to cancel the  appointment      Sent to [facility]

## 2022-01-07 ENCOUNTER — Telehealth

## 2022-01-07 MED ORDER — HYDROXYZINE HCL 25 MG PO TABS
25 MG | ORAL_TABLET | Freq: Three times a day (TID) | ORAL | 0 refills | Status: AC | PRN
Start: 2022-01-07 — End: 2022-01-17

## 2022-01-07 NOTE — Telephone Encounter (Signed)
I sent and the hydroxyzine. He knows. Set him up for an appointment to see me in the next couple of weeks about the hives that he is having

## 2022-01-07 NOTE — Telephone Encounter (Signed)
States that she was returning a call & ask that we please give Mrs. [name] a call back when they do return the call     Sent to [facility]

## 2022-01-07 NOTE — Telephone Encounter (Signed)
States that Pt needs a refill on hydroxyzine-acl 25 mg, takes 1 tablet every 8 hours    [phone]    Sent to [facility]

## 2022-01-08 NOTE — Telephone Encounter (Signed)
Pt wife scheduled appt for 4/17 11am

## 2022-01-12 ENCOUNTER — Ambulatory Visit: Admit: 2022-01-12 | Discharge: 2022-01-12 | Payer: MEDICARE | Attending: Internal Medicine | Primary: Internal Medicine

## 2022-01-12 DIAGNOSIS — I10 Essential (primary) hypertension: Secondary | ICD-10-CM

## 2022-01-12 MED ORDER — CYCLOBENZAPRINE HCL 10 MG PO TABS
10 MG | ORAL_TABLET | ORAL | 0 refills | Status: AC
Start: 2022-01-12 — End: ?

## 2022-01-12 MED ORDER — DULOXETINE HCL 30 MG PO CPEP
30 MG | ORAL_CAPSULE | Freq: Every day | ORAL | 5 refills | Status: AC
Start: 2022-01-12 — End: 2022-06-25

## 2022-01-12 NOTE — Progress Notes (Signed)
CHIEF COMPLAINT:  Chief Complaint   Patient presents with    Other     Hives for the last 6-8 weeks         HISTORY OF PRESENT ILLNESS:  [name] is a 71 y.o. male  who presents the office for follow-up.  He is continuing to have intermittent episodes of hive-like rash.  It appears to be somewhat fungal in the fact that it is annular.  He was treated with 7 days worth of Diflucan which was not helpful.  I cannot see any scaling.  He relates that it will start out and over and then it will fill and and will hive up and be firm but tender.  It then goes away.  It has affected his arms, torso, upper legs.  He relates he believes it starting to get better.  He has used some hydroxyzine.  He has no other new substances.  He thought it may be related to a new detergent that he was using.    Overall he feels improved and does not think he needs to do anything else right now.    PHQ:  No flowsheet data found.    CURRENT MEDICATION LIST:    Current Outpatient Medications   Medication Sig Dispense Refill    DULoxetine (CYMBALTA) 30 MG extended release capsule Take 1 capsule by mouth daily 30 capsule 5    cyclobenzaprine (FLEXERIL) 10 MG tablet 0.5 tab in am and 1 tab at bedtime 20 tablet 0    hydrOXYzine HCl (ATARAX) 25 MG tablet Take 1 tablet by mouth every 8 hours as needed for Itching 30 tablet 0    Bacillus Coagulans-Inulin (PROBIOTIC FORMULA PO) Take 1 tablet by mouth daily       No current facility-administered medications for this visit.        ALLERGIES:    Allergies   Allergen Reactions    Aspirin Effervescent      Other reaction(s): Itch    Chlorphen-Phenyleph-Asa Itching     Event:      Metoprolol Tartrate Itching     Event:          HISTORY:  Past Medical History:   Diagnosis Date    Abdominal wall ulcer (HCC)     Anxiety     Crohn's disease (HCC)     Diarrhea     Gallstones     GI disease     Hypertension     Joint pain     Nausea     Pneumonia     Vomiting       Past Surgical History:   Procedure Laterality  Date    CHOLECYSTECTOMY, LAPAROSCOPIC  01/03/2015    COLECTOMY  1982    OTHER SURGICAL HISTORY  04/10/2019    laparoscopic robotic assisted redo ileocolic resection with primary antiperistaltic extracorporeal anastomosis, omental nodule biopsy ([facility])      Social History     Socioeconomic History    Marital status: Unknown     Spouse name: Not on file    Number of children: Not on file    Years of education: Not on file    Highest education level: Not on file   Occupational History    Not on file   Tobacco Use    Smoking status: Every Day     Packs/day: 0.50     Years: 30.00     Pack years: 15.00     Types: Cigarettes    Smokeless tobacco:  Never   Vaping Use    Vaping Use: Never used   Substance and Sexual Activity    Alcohol use: Not Currently    Drug use: Yes     Types: Marijuana Sheran Fava)    Sexual activity: Not on file   Other Topics Concern    Not on file   Social History Narrative    Not on file     Social Determinants of Health     Financial Resource Strain: Not on file   Food Insecurity: Not on file   Transportation Needs: Not on file   Physical Activity: Not on file   Stress: Not on file   Social Connections: Not on file   Intimate Partner Violence: Not on file   Housing Stability: Not on file      Family History   Problem Relation Age of Onset    Diabetes Father     Hypertension Father     Lung Cancer Father 56    Diabetes Brother     Hypertension Brother     Liver Cancer Brother     Arthritis Daughter         REVIEW OF SYSTEMS:  Pertinent items are noted in HPI.    PHYSICAL EXAM:  Vital Signs -   Visit Vitals  BP 136/81   Pulse 66   Wt 188 lb 6.4 oz (85.5 kg)   SpO2 99%    There is no height or weight on file to calculate BMI.   General Appearance:  awake, alert, oriented, in no acute distress and well developed, well nourished  Lungs:  Normal expansion.  Clear to auscultation.  No rales, rhonchi, or wheezing.  Heart:  Heart sounds are normal.  Regular rate and rhythm without murmur, gallop or  rub.  Abdomen:  Soft, non-tender, normal bowel sounds.  No bruits, organomegaly or masses.  Extremities: Extremities warm to touch, pink, with no edema. and pulses present in all extremities  Musculoskeletal:  Range of motion normal in hips, knees, shoulders, and spine.  Neurologic:  Gait normal. Reflexes normal and symmetric. Sensation grossly intact.  Skin: Annular rash in multiple areas which is very mild        LABS  No results found for this visit on 01/12/22.  No results found for any previous visit.       IMPRESSION/PLAN    1. Essential (primary) hypertension  Blood pressure well controlled.  Update labs  - Cbc With Auto Differential; Future  - Comprehensive Metabolic Panel; Future  - TSH; Future  - Lipid Panel; Future    2. Screening PSA (prostate specific antigen)  Patient is due for PSA screening  - PSA Screening; Future    3. Pulled muscle  Stable    4. Depression, unspecified depression type  You current regimen    5. Rash  Patient improving.  Has hydroxyzine if necessary which is quite effective.  He will call me if symptoms worsen again and will refer him to dermatology        Follow up and Dispositions:  Return for Schedule.       Louis Lav, MD

## 2022-01-26 ENCOUNTER — Encounter

## 2022-01-26 ENCOUNTER — Telehealth

## 2022-01-26 LAB — COMPREHENSIVE METABOLIC PANEL
ALT: 19 U/L (ref 0–50)
AST: 21 U/L (ref 0–50)
Albumin/Globulin Ratio: 1.6 (ref 1.00–2.70)
Albumin: 4.3 g/dL (ref 3.5–5.2)
Alk Phosphatase: 102 U/L (ref 40–130)
Anion Gap: 13 mmol/L (ref 2–17)
BUN: 13 mg/dL (ref 8–23)
CO2: 23 mmol/L (ref 22–29)
Calcium: 9.2 mg/dL (ref 8.8–10.2)
Chloride: 105 mmol/L (ref 98–107)
Creatinine: 1.1 mg/dL (ref 0.7–1.3)
Est, Glom Filt Rate: 72 mL/min/1.73m (ref >=60)
Globulin: 2.7 g/dL (ref 1.9–4.4)
Glucose: 105 mg/dL — ABNORMAL HIGH (ref 70–99)
OSMOLALITY CALCULATED: 282 mOsm/kg (ref 270–287)
Potassium: 4.5 mmol/L (ref 3.5–5.3)
Sodium: 141 mmol/L (ref 135–145)
Total Bilirubin: 0.27 mg/dL (ref 0.00–1.20)
Total Protein: 7 g/dL (ref 6.4–8.3)

## 2022-01-26 LAB — TSH: TSH, 3RD GENERATION: 1.69 mcIU/mL (ref 0.358–3.740)

## 2022-01-26 LAB — LIPID PANEL
Chol/HDL Ratio: 2.4 (ref 0.0–4.4)
Cholesterol: 130 mg/dL (ref 100–200)
HDL: 54 mg/dL (ref >=40)
LDL Cholesterol: 42.2 mg/dL (ref 0.0–100.0)
LDL/HDL Ratio: 0.8
Triglycerides: 169 mg/dL — ABNORMAL HIGH (ref 0–149)
VLDL: 33.8 mg/dL (ref 5.0–40.0)

## 2022-01-26 LAB — CBC WITH AUTO DIFFERENTIAL
Absolute Baso #: 0.1 10*3/uL (ref 0.0–0.2)
Absolute Eos #: 0.3 10*3/uL (ref 0.0–0.5)
Absolute Lymph #: 2.2 10*3/uL (ref 1.0–3.2)
Absolute Mono #: 0.7 10*3/uL (ref 0.3–1.0)
Basophils %: 0.7 % (ref 0.0–2.0)
Eosinophils %: 2.9 % (ref 0.0–7.0)
Hematocrit: 42.7 % (ref 38.0–52.0)
Hemoglobin: 14.6 g/dL (ref 13.0–17.3)
Immature Grans (Abs): 0.02 10*3/uL (ref 0.00–0.06)
Immature Granulocytes: 0.2 % (ref 0.0–0.6)
Lymphocytes: 25.2 % (ref 15.0–45.0)
MCH: 32.2 pg (ref 27.0–34.5)
MCHC: 34.2 g/dL (ref 32.0–36.0)
MCV: 94.3 fL (ref 84.0–100.0)
MPV: 10.4 fL (ref 7.2–13.2)
Monocytes: 7.6 % (ref 4.0–12.0)
NRBC Absolute: 0 10*3/uL (ref 0.000–0.012)
NRBC Automated: 0 % (ref 0.0–0.2)
Neutrophils %: 63.4 % (ref 42.0–74.0)
Neutrophils Absolute: 5.6 10*3/uL (ref 1.6–7.3)
Platelets: 381 10*3/uL (ref 140–440)
RBC: 4.53 x10e6/mcL (ref 4.00–5.60)
RDW: 13.4 % (ref 11.0–16.0)
WBC: 8.8 10*3/uL (ref 3.8–10.6)

## 2022-01-26 LAB — PSA SCREENING: Screening PSA: 0.828 ng/mL (ref 0.000–4.000)

## 2022-01-26 MED ORDER — ALPRAZOLAM 0.5 MG PO TABS
0.5 MG | ORAL_TABLET | ORAL | 5 refills | Status: AC
Start: 2022-01-26 — End: 2024-01-27

## 2022-01-26 NOTE — Telephone Encounter (Signed)
Pt states that someone from Dr.Snyder's office gave him a call but he didn't know who it was    Pt states to please return the call    Sent to [facility]

## 2022-01-26 NOTE — Telephone Encounter (Signed)
Patient requesting a refill of Xanax .    Not in med management       [id number]    Sent to [facility]

## 2022-01-26 NOTE — Telephone Encounter (Signed)
I let pt know blood work looked good

## 2022-01-26 NOTE — Telephone Encounter (Signed)
I sent in prescription for alprazolam.  Let him know

## 2022-03-12 NOTE — Telephone Encounter (Signed)
ADDRESSED

## 2022-03-30 ENCOUNTER — Ambulatory Visit: Admit: 2022-03-30 | Discharge: 2022-03-30 | Payer: MEDICARE | Attending: Internal Medicine | Primary: Internal Medicine

## 2022-03-30 ENCOUNTER — Encounter

## 2022-03-30 DIAGNOSIS — Z0000 Encounter for general adult medical examination without abnormal findings: Secondary | ICD-10-CM

## 2022-03-30 LAB — URINALYSIS W/ RFLX MICROSCOPIC
Bilirubin Urine: NEGATIVE
Blood, Urine: NEGATIVE
Glucose, UA: NEGATIVE
Ketones, Urine: NEGATIVE
Leukocyte Esterase, Urine: NEGATIVE
Nitrite, Urine: NEGATIVE
Specific Gravity, UA: 1.027 (ref 1.003–1.035)
Urobilinogen, Urine: 0.2 EU/dL
pH, UA: 6 (ref 4.5–8.0)

## 2022-03-30 MED ORDER — ALPRAZOLAM 0.5 MG PO TABS
0.5 MG | ORAL_TABLET | ORAL | 5 refills | Status: AC
Start: 2022-03-30 — End: 2024-03-30

## 2022-03-30 NOTE — Telephone Encounter (Signed)
Vm is full, cannot leave message

## 2022-03-30 NOTE — Patient Instructions (Signed)
Substance Use Disorder: Care Instructions  Overview     You can improve your life and health by stopping your use of alcohol or drugs. When you don't drink or use drugs, you may feel and sleep better. You may get along better with your family, friends, and coworkers. There are medicines and programs that can help with substance use disorder.  How can you care for yourself at home?  If you have been given medicine to help keep you sober or reduce your cravings, be sure to take it exactly as prescribed.  Talk to your doctor about programs that can help you stop using drugs or drinking alcohol.  Do not tempt yourself by keeping alcohol or drugs in your home.  Learn how to say no when other people drink or use drugs. Or don't spend time with people who drink or use drugs.  Use the time and money spent on drinking or drugs to do something fun with your family or friends.  Preventing a relapse  Do not drink alcohol or use drugs at all. Using any amount of alcohol or drugs greatly increases your risk for relapse.  Seek help from organizations such as Alcoholics Anonymous, Narcotics Anonymous, or treatment facilities if you feel the need to drink alcohol or use drugs again.  Remember that recovery is a lifelong process.  Stay away from situations, friends, or places that may lead you to drink or use drugs.  Have a plan to spot and deal with relapse. Learn to recognize changes in your thinking that lead you to drink or use drugs. These are warning signs. Get help before you start to drink or use drugs again.  Get help as soon as you can if you relapse. Some people make a plan with another person that outlines what they want that person to do for them if they relapse. The plan usually includes how to handle the relapse and who to notify in case of relapse.  Don't give up. Remember that a relapse does not mean that you have failed. Use the experience to learn the triggers that lead you to drink or use drugs. Then quit  again. Many people have several relapses before they are able to quit for good.  Follow-up care is a key part of your treatment and safety. Be sure to make and go to all appointments, and call your doctor if you are having problems. It's also a good idea to know your test results and keep a list of the medicines you take.  When should you call for help?   Call 911  anytime you think you may need emergency care. For example, call if:   You feel you cannot stop from hurting yourself or someone else.   Call your doctor now or seek immediate medical care if:   You have serious withdrawal symptoms, such as confusion, hallucinations, severe trembling, or seizures.   Watch closely for changes in your health, and be sure to contact your doctor if:   You have a relapse.    You need more help or support to stop.   Where can you learn more?  Go to https://www.bennett.info/ and enter H573 to learn more about "Substance Use Disorder: Care Instructions."  Current as of: March 22, 2023Content Version: 13.7   2006-2023 [employer]wise, Incorporated.   Care instructions adapted under license by [facility]. If you have questions about a medical condition or this instruction, always ask your healthcare professional. Healthwise, Incorporated disclaims any warranty or  liability for your use of this information.           Learning About Dental Care for Older Adults  Dental care for older adults: Overview  Dental care for older people is much the same as for younger adults. But older adults do have concerns that younger adults do not. Older adults may have problems with gum disease and decay on the roots of their teeth. They may need missing teeth replaced or broken fillings fixed. Or they may have dentures that need to be cared for. Some older adults may have trouble holding a toothbrush.  You can help remind the person you are caring for to brush and floss their teeth or to clean their dentures. In some  cases, you may need to do the brushing and other dental care tasks. People who have trouble using their hands or who have dementia may need this extra help.  How can you help with dental care?  Normal dental care  To keep the teeth and gums healthy:  Brush the teeth with fluoride toothpaste twice a day--in the morning and at night--and floss at least once a day. Plaque can quickly build up on the teeth of older adults.  Watch for the signs of gum disease. These signs include gums that bleed after brushing or after eating hard foods, such as apples.  See a dentist regularly. Many experts recommend checkups every 6 months.  Keep the dentist up to date on any new medications the person is taking.  Encourage a balanced diet that includes whole grains, vegetables, and fruits, and that is low in saturated fat and sodium.  Encourage the person you're caring for not to use tobacco products. They can affect dental and general health.  Many older adults have a fixed income and feel that they can't afford dental care. But most towns and cities have programs in which dentists help older adults by lowering fees. Contact your area's public health offices or social services for information about dental care in your area.  Using a toothbrush  Older adults with arthritis sometimes have trouble brushing their teeth because they can't easily hold the toothbrush. Their hands and fingers may be stiff, painful, or weak. If this is the case, you can:  Offer an IT trainer toothbrush.  Enlarge the handle of a non-electric toothbrush by wrapping a sponge, an elastic bandage, or adhesive tape around it.  Push the toothbrush handle through a ball made of rubber or soft foam.  Make the handle longer and thicker by taping Popsicle sticks or tongue depressors to it.  You may also be able to buy special toothbrushes, toothpaste dispensers, and floss holders.  Your doctor may recommend a soft-bristle toothbrush if the person you care for bleeds  easily. Bleeding can happen because of a health problem or from certain medicines.  A toothpaste for sensitive teeth may help if the person you care for has sensitive teeth.  How do you brush and floss someone's teeth?  If the person you are caring for has a hard time cleaning their teeth on their own, you may need to brush and floss their teeth for them. It may be easiest to have the person sit and face away from you, and to sit or stand behind them. That way you can steady their head against your arm as you reach around to floss and brush their teeth. Choose a place that has good lighting and is comfortable for both of you.  Before you begin,  gather your supplies. You will need gloves, floss, a toothbrush, and a container to hold water if you are not near a sink. Wash and dry your hands well and put on gloves. Start by flossing:  Gently work a piece of floss between each of the teeth toward the gums. A plastic flossing tool may make this easier, and they are available at most drugstores.  Curve the floss around each tooth into a U-shape and gently slide it under the gum line.  Move the floss firmly up and down several times to scrape off the plaque.  After you've finished flossing, throw away the used floss and begin brushing:  Wet the brush and apply toothpaste.  Place the brush at a 45-degree angle where the teeth meet the gums. Press firmly, and move the brush in small circles over the surface of the teeth.  Be careful not to brush too hard. Vigorous brushing can make the gums pull away from the teeth and can scratch the tooth enamel.  Brush all surfaces of the teeth, on the tongue side and on the cheek side. Pay special attention to the front teeth and all surfaces of the back teeth.  Brush chewing surfaces with short back-and-forth strokes.  After you've finished, help the person rinse the remaining toothpaste from their mouth.  Where can you learn more?  Go to https://www.bennett.info/ and enter  F944 to learn more about "[place] for Older Adults."  Current as of: November 14, 2022Content Version: 13.7   2006-2023 Healthwise, Incorporated.   Care instructions adapted under license by [facility]. If you have questions about a medical condition or this instruction, always ask your healthcare professional. [place] any warranty or liability for your use of this information.           Hearing Loss: Care Instructions  Overview     Hearing loss is a sudden or slow decrease in how well you hear. It can range from slight to profound. Permanent hearing loss can occur with aging. It also can happen when you are exposed long-term to loud noise. Examples include listening to loud music, riding motorcycles, or being around other loud machines.  Hearing loss can affect your work and home life. It can make you feel lonely or depressed. You may feel that you have lost your independence. But hearing aids and other devices can help you hear better and feel connected to others.  Follow-up care is a key part of your treatment and safety. Be sure to make and go to all appointments, and call your doctor if you are having problems. It's also a good idea to know your test results and keep a list of the medicines you take.  How can you care for yourself at home?  Avoid loud noises whenever possible. This helps keep your hearing from getting worse.  Always wear hearing protection around loud noises.  Wear a hearing aid as directed.  A professional can help you pick a hearing aid that will work best for you.  You can also get hearing aids over the counter for mild to moderate hearing loss.  Have hearing tests as your doctor suggests. They can show whether your hearing has changed. Your hearing aid may need to be adjusted.  Use other devices as needed. These may include:  Telephone amplifiers and hearing aids that can connect to a television, stereo, radio, or  microphone.  Devices that use lights or vibrations. These alert you to the  doorbell, a ringing telephone, or a baby monitor.  Television closed-captioning. This shows the words at the bottom of the screen. Most new TVs can do this.  TTY (text telephone). This lets you type messages back and forth on the telephone instead of talking or listening. These devices are also called TDD. When messages are typed on the keyboard, they are sent over the phone line to a receiving TTY. The message is shown on a monitor.  Use text messaging, social media, and email if it is hard for you to communicate by telephone.  Try to learn a listening technique called speechreading. It is not lipreading. You pay attention to people's gestures, expressions, posture, and tone of voice. These clues can help you understand what a person is saying. Face the person you are talking to, and have them face you. Make sure the lighting is good. You need to see the other person's face clearly.  Think about counseling if you need help to adjust to your hearing loss.  When should you call for help?  Watch closely for changes in your health, and be sure to contact your doctor if:   You think your hearing is getting worse.    You have new symptoms, such as dizziness or nausea.   Where can you learn more?  Go to https://www.bennett.info/ and enter R798 to learn more about "Hearing Loss: Care Instructions."  Current as of: March 1, 2023Content Version: 13.7   2006-2023 Healthwise, Incorporated.   Care instructions adapted under license by [facility]. If you have questions about a medical condition or this instruction, always ask your healthcare professional. [place] any warranty or liability for your use of this information.           Learning About Vision Tests  What are vision tests?     The four most common vision tests are visual acuity tests, refraction, visual field tests, and color vision  tests.  Visual acuity (sharpness) tests  These tests are used:  To see if you need glasses or contact lenses.  To monitor an eye problem.  To check an eye injury.  Visual acuity tests are done as part of routine exams. You may also have this test when you get your driver's license or apply for some types of jobs.  Visual field tests  These tests are used:  To check for vision loss in any area of your range of vision.  To screen for certain eye diseases.  To look for nerve damage after a stroke, head injury, or other problem that could reduce blood flow to the brain.  Refraction and color tests  A refraction test is done to find the right prescription for glasses and contact lenses.  A color vision test is done to check for color blindness.  Color vision is often tested as part of a routine exam. You may also have this test when you apply for a job where recognizing different colors is important, such as truck driving, Research officer, trade union, or the [employer].  How are vision tests done?  Visual acuity test   You cover one eye at a time.  You read aloud from a wall chart across the room.  You read aloud from a small card that you hold in your hand.  Refraction   You look into a special device.  The device puts lenses of different strengths in front of each eye to see how strong your glasses or contact lenses need to be.  Visual  field tests   Your doctor may have you look through special machines.  Or your doctor may simply have you stare straight ahead while they move a finger into and out of your field of vision.  Color vision test   You look at pieces of printed test patterns in various colors. You say what number or symbol you see.  Your doctor may have you trace the number or symbol using a pointer.  How do these tests feel?  There is very little chance of having a problem from this test. If dilating drops are used for a vision test, they may make the eyes sting and cause a medicine taste in the mouth.  Follow-up care is a  key part of your treatment and safety. Be sure to make and go to all appointments, and call your doctor if you are having problems. It's also a good idea to know your test results and keep a list of the medicines you take.  Where can you learn more?  Go to RecruitSuit.ca and enter G551 to learn more about "Learning About Vision Tests."  Current as of: October 12, 2022Content Version: 13.7   2006-2023 Healthwise, Incorporated.   Care instructions adapted under license by [facility]. If you have questions about a medical condition or this instruction, always ask your healthcare professional. Healthwise, Incorporated disclaims any warranty or liability for your use of this information.           Advance Directives: Care Instructions  Overview  An advance directive is a legal way to state your wishes at the end of your life. It tells your family and your doctor what to do if you can't say what you want.  There are two main types of advance directives. You can change them any time your wishes change.  Living will.  This form tells your family and your doctor your wishes about life support and other treatment. The form is also called a declaration.  Medical power of attorney.  This form lets you name a person to make treatment decisions for you when you can't speak for yourself. This person is called a health care agent (health care proxy, health care surrogate). The form is also called a durable power of attorney for health care.  If you do not have an advance directive, decisions about your medical care may be made by a family member, or by a doctor or a judge who doesn't know you.  It may help to think of an advance directive as a gift to the people who care for you. If you have one, they won't have to make tough decisions by themselves.  For more information, including forms for your state, see the [employer] website (PlumberB[employer].com.cy).  Follow-up  care is a key part of your treatment and safety. Be sure to make and go to all appointments, and call your doctor if you are having problems. It's also a good idea to know your test results and keep a list of the medicines you take.  What should you include in an advance directive?  Many states have a unique advance directive form. (It may ask you to address specific issues.) Or you might use a universal form that's approved by many states.  If your form doesn't tell you what to address, it may be hard to know what to include in your advance directive. Use the questions below to help you get started.  Who do you want to make decisions about your medical  care if you are not able to?  What life-support measures do you want if you have a serious illness that gets worse over time or can't be cured?  What are you most afraid of that might happen? (Maybe you're afraid of having pain, losing your independence, or being kept alive by machines.)  Where would you prefer to die? (Your home? A hospital? A nursing home?)  Do you want to donate your organs when you die?  Do you want certain religious practices performed before you die?  When should you call for help?  Be sure to contact your doctor if you have any questions.  Where can you learn more?  Go to [employer].ca and enter R264 to learn more about "Advance Directives: Care Instructions."  Current as of: March 27, 2023Content Version: 13.7   2006-2023 Healthwise, Incorporated.   Care instructions adapted under license by [facility]. If you have questions about a medical condition or this instruction, always ask your healthcare professional. Healthwise, Incorporated disclaims any warranty or liability for your use of this information.           A Healthy Heart: Care Instructions  Your Care Instructions     Coronary artery disease, also called heart disease, occurs when a substance called plaque builds up in the vessels that supply  oxygen-rich blood to your heart muscle. This can narrow the blood vessels and reduce blood flow. A heart attack happens when blood flow is completely blocked. A high-fat diet, smoking, and other factors increase the risk of heart disease.  Your doctor has found that you have a chance of having heart disease. You can do lots of things to keep your heart healthy. It may not be easy, but you can change your diet, exercise more, and quit smoking. These steps really work to lower your chance of heart disease.  Follow-up care is a key part of your treatment and safety. Be sure to make and go to all appointments, and call your doctor if you are having problems. It's also a good idea to know your test results and keep a list of the medicines you take.  How can you care for yourself at home?  Diet   Use less salt when you cook and eat. This helps lower your blood pressure. Taste food before salting. Add only a little salt when you think you need it. With time, your taste buds will adjust to less salt.    Eat fewer snack items, fast foods, canned soups, and other high-salt, high-fat, processed foods.    Read food labels and try to avoid saturated and trans fats. They increase your risk of heart disease by raising cholesterol levels.    Limit the amount of solid fat-butter, margarine, and shortening-you eat. Use olive, peanut, or canola oil when you cook. Bake, broil, and steam foods instead of frying them.    Eat a variety of fruit and vegetables every day. Dark green, deep orange, red, or yellow fruits and vegetables are especially good for you. Examples include spinach, carrots, peaches, and berries.    Foods high in fiber can reduce your cholesterol and provide important vitamins and minerals. High-fiber foods include whole-grain cereals and breads, oatmeal, beans, brown rice, citrus fruits, and apples.    Eat lean proteins. Heart-healthy proteins include seafood, lean meats and poultry, eggs, beans, peas, nuts,  seeds, and soy products.    Limit drinks and foods with added sugar. These include candy, desserts, and soda pop.  Lifestyle changes   If your doctor recommends it, get more exercise. Walking is a good choice. Bit by bit, increase the amount you walk every day. Try for at least 30 minutes on most days of the week. You also may want to swim, bike, or do other activities.    Do not smoke. If you need help quitting, talk to your doctor about stop-smoking programs and medicines. These can increase your chances of quitting for good. Quitting smoking may be the most important step you can take to protect your heart. It is never too late to quit.    Limit alcohol to 2 drinks a day for men and 1 drink a day for women. Too much alcohol can cause health problems.    Manage other health problems such as diabetes, high blood pressure, and high cholesterol. If you think you may have a problem with alcohol or drug use, talk to your doctor.   Medicines   Take your medicines exactly as prescribed. Call your doctor if you think you are having a problem with your medicine.    If your doctor recommends aspirin, take the amount directed each day. Make sure you take aspirin and not another kind of pain reliever, such as acetaminophen (Tylenol).   When should you call for help?   Call 911 if you have symptoms of a heart attack. These may include:   Chest pain or pressure, or a strange feeling in the chest.    Sweating.    Shortness of breath.    Pain, pressure, or a strange feeling in the back, neck, jaw, or upper belly or in one or both shoulders or arms.    Lightheadedness or sudden weakness.    A fast or irregular heartbeat.   After you call 911, the operator may tell you to chew 1 adult-strength or 2 to 4 low-dose aspirin. Wait for an ambulance. Do not try to drive yourself.  Watch closely for changes in your health, and be sure to contact your doctor if you have any problems.  Where can you learn more?  Go to  RecruitSuit.ca and enter F075 to learn more about "A Healthy Heart: Care Instructions."  Current as of: September 7, 2022Content Version: 13.7   2006-2023 Healthwise, Incorporated.   Care instructions adapted under license by [facility]. If you have questions about a medical condition or this instruction, always ask your healthcare professional. Healthwise, Incorporated disclaims any warranty or liability for your use of this information.      Personalized Preventive Plan for Vipul Cafarelli [facility] - 03/30/2022  Medicare offers a range of preventive health benefits. Some of the tests and screenings are paid in full while other may be subject to a deductible, co-insurance, and/or copay.    Some of these benefits include a comprehensive review of your medical history including lifestyle, illnesses that may run in your family, and various assessments and screenings as appropriate.    After reviewing your medical record and screening and assessments performed today your provider may have ordered immunizations, labs, imaging, and/or referrals for you.  A list of these orders (if applicable) as well as your Preventive Care list are included within your After Visit Summary for your review.    Other Preventive Recommendations:    A preventive eye exam performed by an eye specialist is recommended every 1-2 years to screen for glaucoma; cataracts, macular degeneration, and other eye disorders.  A preventive dental visit is recommended every 6 months.  Try  to get at least 150 minutes of exercise per week or 10,000 steps per day on a pedometer .  Order or download the FREE "Exercise & Physical Activity: Your Everyday Guide" from [employer]. Call ([phone] or search [employer] online.  You need 1200-1500 mg of calcium and 1000-2000 IU of vitamin D per day. It is possible to meet your calcium requirement with diet alone, but a vitamin D supplement  is usually necessary to meet this goal.  When exposed to the sun, use a sunscreen that protects against both UVA and UVB radiation with an SPF of 30 or greater. Reapply every 2 to 3 hours or after sweating, drying off with a towel, or swimming.  Always wear a seat belt when traveling in a car. Always wear a helmet when riding a bicycle or motorcycle.

## 2022-03-30 NOTE — Progress Notes (Signed)
CHIEF COMPLAINT:  Chief Complaint   Patient presents with    Medicare AWV        HISTORY OF PRESENT ILLNESS:  Mr. Studstill is a 71 y.o. male  who presents to the office for medical follow-up he is also here for annual wellness visit..  Relates he is feeling well.  He is voicing complaints of chronic abdominal pain from Crohn's disease.  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  He has been on disease modifying agents but relates they really did not help him very much so he has discontinued them and is not interested in taking anything for them.  He does relate prednisone makes him feel very good but he understands that he cannot take that chronically.    He denies chest pain, shortness of breath, fevers or chills.    About a patient history form, functional abilities assessment and a depression assessment.  It was reviewed by me and scanned into the document    PHQ:  PHQ-9  03/30/2022   Little interest or pleasure in doing things 0   Feeling down, depressed, or hopeless 0   PHQ-2 Score 0   PHQ-9 Total Score 0       CURRENT MEDICATION LIST:    Current Outpatient Medications   Medication Sig Dispense Refill    ALPRAZolam (XANAX) 0.5 MG tablet Take 0.5 tablet by mouth three times per day 45 tablet 5    Bacillus Coagulans-Inulin (PROBIOTIC FORMULA PO) Take 1 tablet by mouth daily      DULoxetine (CYMBALTA) 30 MG extended release capsule Take 1 capsule by mouth daily (Patient not taking: Reported on 03/30/2022) 30 capsule 5    cyclobenzaprine (FLEXERIL) 10 MG tablet 0.5 tab in am and 1 tab at bedtime (Patient not taking: Reported on 03/30/2022) 20 tablet 0     No current facility-administered medications for this visit.        ALLERGIES:    Allergies   Allergen Reactions    Aspirin Effervescent      Other reaction(s): Itch    Chlorphen-Phenyleph-Asa Itching     Event:      Metoprolol Tartrate Itching     Event:          HISTORY:  Past Medical History:   Diagnosis Date    Abdominal wall ulcer (HCC)      Anxiety     Crohn's disease (HCC)     Diarrhea     Gallstones     GI disease     Hypertension     Joint pain     Nausea     Pneumonia     Vomiting       Past Surgical History:   Procedure Laterality Date    CHOLECYSTECTOMY, LAPAROSCOPIC  01/03/2015    COLECTOMY  1982    OTHER SURGICAL HISTORY  04/10/2019    laparoscopic robotic assisted redo ileocolic resection with primary antiperistaltic extracorporeal anastomosis, omental nodule biopsy ([facility])      Social History     Socioeconomic History    Marital status: Unknown     Spouse name: Not on file    Number of children: Not on file    Years of education: Not on file    Highest education level: Not on file   Occupational History    Not on file   Tobacco Use    Smoking status: Every Day     Packs/day: 0.50     Years: 30.00  Pack years: 15.00     Types: Cigarettes    Smokeless tobacco: Never   Vaping Use    Vaping Use: Never used   Substance and Sexual Activity    Alcohol use: Not Currently    Drug use: Yes     Types: Marijuana Sherrie Mustache)    Sexual activity: Not on file   Other Topics Concern    Not on file   Social History Narrative    Not on file     Social Determinants of Health     Financial Resource Strain: Not on file   Food Insecurity: Not on file   Transportation Needs: Not on file   Physical Activity: Insufficiently Active    Days of Exercise per Week: 7 days    Minutes of Exercise per Session: 20 min   Stress: Not on file   Social Connections: Not on file   Intimate Partner Violence: Not on file   Housing Stability: Not on file      Family History   Problem Relation Age of Onset    Diabetes Father     Hypertension Father     Lung Cancer Father 29    Diabetes Brother     Hypertension Brother     Liver Cancer Brother     Arthritis Daughter         REVIEW OF SYSTEMS:  Constitutional: negative  Eyes: negative  Ears, nose, mouth, throat, and face: negative  Respiratory: negative  Cardiovascular: negative  Gastrointestinal: Chronic abdominal pain genitourinary:  negative  Integument/breast: negative  Hematologic/lymphatic: negative  Musculoskeletal: The articular arthritis neurological: negative  Behavioral/Psych: negative  Endocrine: negative      PHYSICAL EXAM:  Vital Signs -   Visit Vitals  BP 128/76   Pulse 63   Wt 184 lb (83.5 kg)   SpO2 98%    There is no height or weight on file to calculate BMI.   General Appearance:  awake, alert, oriented, in no acute distress and well developed, well nourished  Skin: No rashes or abnormal skin lesions  Head/face: Normocephalic and atraumatic  Eyes:  PERRL, EOMI, and Sclera nonicteric  Ears:  canals and TMs NI  Nose/Sinuses:  Nares normal. Septum midline. Mucosa normal. No drainage or sinus tenderness.  Mouth/Throat:  Mucosa moist.  No lesions.  Pharynx without erythema, edema or exudate.  Neck: Supple without masses, thyromegaly, carotid bruits.  Back: Without abnormalities  Lungs:  Normal expansion.  Clear to auscultation.  No rales, rhonchi, or wheezing.  Heart:  Heart sounds are normal.  Regular rate and rhythm without murmur, gallop or rub.  Rectal: Normal tone, no masses, prostate normal shape and size  Abdomen:  Soft, non-tender, normal bowel sounds.  No bruits, organomegaly or masses.  Extremities: Extremities warm to touch, pink, with no edema. and pulses present in all extremities  Musculoskeletal:  normal range of motion.  Peripheral vascular: pulses 2+ throughout  Neurologic:  Gait normal. Reflexes normal and symmetric. Sensation grossly intact.      LABS  No results found for this visit on 03/30/22.  Orders Only on 01/26/2022   Component Date Value Ref Range Status    Screening PSA 01/26/2022 0.828  0.000 - 4.000 ng/mL Final    Comment: PSA INTERPRETATION:    PSA measured by Roche Cobas electrochemiluminescence immunoassay "ECLIA"  methodology.    At this time, no major scientific/medical organization including the [place] and the American Urological Association have specific  recommendations in  regard to prostate specific antigen (PSA) testing other  than recommending that men at age 87 and above and those who are at high risk  at age 49-45 and above be counseled in regard to the pros and cons of PSA  testing.    Guidelines for risk stratification    1.  Below 1:  Very low risk of prostate cancer  2.  1-4:       Approximately 15% risk of prostate cancer  3.  4-10:      25% risk of prostate cancer  4.  >10:       50% risk of prostate cancer    The use of PSA velocity (rate of rise of PSA over time) may also be useful in  predicting the risk of prostate cancer.  Most authorities recommend PSA  testing on at least three occasions over a period of 18 months to get an  accurate PSA velocity.    References                            available by request.      Cholesterol 01/26/2022 130  100 - 200 mg/dL Final    Comment: The National Cholesterol Education Program has published reference  cholesterol values for cardiovascular risk to be:    Less than 200 mg/dL     = Low Risk    200 to 239 mg/dL        = Borderline Risk    259m/dL and greater    = High Risk      HDL 01/26/2022 54  >=40 mg/dL Final    Comment: The National Lipid Association and the [employer]olesterol Education Program  (NCEP) have set the guidelines for high-density lipoprotein (HDL) cholesterol  in adults ages 149and up.      Triglycerides 01/26/2022 169 (H)  0 - 149 mg/dL Final    Comment:   TRIGLYCERIDE INTERPRETATION:                          Recommended Fasting Triglyc Levels for Adults                        =============================================                        Desirable                        < 150 mg/dL                        Average                          < 200 mg/dL                        Borderline High             200 to 500 mg/dL                        Hypertriglyceridemic             > 500 mg/dL                        =============================================  LDL Cholesterol 01/26/2022 42.2  0.0 - 100.0  mg/dL Final    LDL/HDL Ratio 01/26/2022 0.8   Final    Chol/HDL Ratio 01/26/2022 2.4  0.0 - 4.4 Final    VLDL 01/26/2022 33.8  5.0 - 40.0 mg/dL Final    TSH, 3RD GENERATION 01/26/2022 1.690  0.358 - 3.740 mcIU/mL Final    Comment: TSH INTERPRETATION:    Controversy exists over an acceptable TSH range. Some experts argue that a  narrower reference range is better and will increase the detection of thyroid  disease, particulary marginal hypothyroidism.      Sodium 01/26/2022 141  135 - 145 mmol/L Final    Potassium 01/26/2022 4.5  3.5 - 5.3 mmol/L Final    Chloride 01/26/2022 105  98 - 107 mmol/L Final    CO2 01/26/2022 23  22 - 29 mmol/L Final    Glucose 01/26/2022 105 (H)  70 - 99 mg/dL Final    BUN 01/26/2022 13  8 - 23 mg/dL Final    Creatinine 01/26/2022 1.1  0.7 - 1.3 mg/dL Final    Anion Gap 01/26/2022 13  2 - 17 mmol/L Final    OSMOLALITY CALCULATED 01/26/2022 282  270 - 287 mOsm/kg Final    Calcium 01/26/2022 9.2  8.8 - 10.2 mg/dL Final    Total Protein 01/26/2022 7.0  6.4 - 8.3 g/dL Final    Albumin 01/26/2022 4.3  3.5 - 5.2 g/dL Final    Globulin 01/26/2022 2.7  1.9 - 4.4 g/dL Final    Albumin/Globulin Ratio 01/26/2022 1.60  1.00 - 2.70 Final    Total Bilirubin 01/26/2022 0.27  0.00 - 1.20 mg/dL Final    Alk Phosphatase 01/26/2022 102  40 - 130 unit/L Final    AST 01/26/2022 21  0 - 50 unit/L Final    ALT 01/26/2022 19  0 - 50 unit/L Final    Est, Glom Filt Rate 01/26/2022 72  >=60 mL/min/1.58mFinal    Comment: VERIFIED by Discern Expert.  GFR Interpretation:                                                                         % OF  KIDNEY  GFR                                                        STAGE  FUNCTION  ==================================================================================    > 90        Normal kidney function                       STAGE 1  90-100%  89 to 60      Mild loss of kidney function                 STAGE 2  80-60%  59 to 45      Mild to moderate loss of kidney function      STAGE 3a  59-45%  44 to 30      Moderate to severe loss of kidney function  STAGE 3b  44-30%  29 to 15      Severe loss of kidney function               STAGE 4  29-15%    < 15        Kidney failure                               STAGE 5  <15%  ==================================================================================  Modified from [facility]    GFR Calculation performed using the CKD-EPI 2021 equation developed for use  with IDMS traceable creatinine methods and                            is the calculation recommended by  the [facility] for estimating GFR in adults.      WBC 01/26/2022 8.8  3.8 - 10.6 x10e3/mcL Final    RBC 01/26/2022 4.53  4.00 - 5.60 x10e6/mcL Final    Hemoglobin 01/26/2022 14.6  13.0 - 17.3 g/dL Final    Hematocrit 01/26/2022 42.7  38.0 - 52.0 % Final    MCV 01/26/2022 94.3  84.0 - 100.0 fL Final    MCH 01/26/2022 32.2  27.0 - 34.5 pg Final    MCHC 01/26/2022 34.2  32.0 - 36.0 g/dL Final    RDW 01/26/2022 13.4  11.0 - 16.0 % Final    Platelets 01/26/2022 381  140 - 440 x10e3/mcL Final    MPV 01/26/2022 10.4  7.2 - 13.2 fL Final    NRBC Automated 01/26/2022 0.0  0.0 - 0.2 % Final    NRBC Absolute 01/26/2022 0.000  0.000 - 0.012 x10e3/mcL Final    Neutrophils % 01/26/2022 63.4  42.0 - 74.0 % Final    Lymphocytes 01/26/2022 25.2  15.0 - 45.0 % Final    Monocytes 01/26/2022 7.6  4.0 - 12.0 % Final    Eosinophils % 01/26/2022 2.9  0.0 - 7.0 % Final    Basophils % 01/26/2022 0.7  0.0 - 2.0 % Final    Neutrophils Absolute 01/26/2022 5.6  1.6 - 7.3 x10e3/mcL Final    Absolute Lymph # 01/26/2022 2.2  1.0 - 3.2 x10e3/mcL Final    Absolute Mono # 01/26/2022 0.7  0.3 - 1.0 x10e3/mcL Final    Absolute Eos # 01/26/2022 0.3  0.0 - 0.5 x10e3/mcL Final    Absolute Baso # 01/26/2022 0.1  0.0 - 0.2 x10e3/mcL Final    Immature Granulocytes 01/26/2022 0.2  0.0 - 0.6 % Final    Immature Grans (Abs) 01/26/2022 0.02  0.00 - 0.06 x10e3/mcL Final       IMPRESSION/PLAN    1.  Medicare annual wellness visit, subsequent  Completed annual wellness exam    2. Essential (primary) hypertension  Blood pressure well controlled  - Urinalysis W/ Rflx Microscopic; Future  - EKG 12 Lead    3. Anxiety  Continue current regimen    4. Crohn's disease of both small and large intestine with other complication [facility])  Patient has tried multiple disease modifying agents in the past and is not interested in taking those.    5. Bilateral primary osteoarthritis of knee  Symptomatic but stable    6. Situational depression  Continue Cymbalta        Follow up and Dispositions:  Return in about 6 months (around 09/30/2022) for 6 months follow-up, 1 year physical.  Jeani Hawking, MD

## 2022-03-30 NOTE — Telephone Encounter (Signed)
-----   Message from Sherri Sear III, MD sent at 03/30/2022  2:46 PM EDT -----  Tell him that his urine looks normal

## 2022-06-23 ENCOUNTER — Telehealth

## 2022-06-23 NOTE — Telephone Encounter (Signed)
Per Dr Graylon Good I ordered and called and let the patient know and got him scheduled for Thurs 06/25/2022 @ 10:00 am with Benjamine Mola

## 2022-06-24 ENCOUNTER — Encounter

## 2022-06-24 LAB — BASIC METABOLIC PANEL
Anion Gap: 13 mmol/L (ref 2–17)
BUN: 13 mg/dL (ref 8–23)
CO2: 28 mmol/L (ref 22–29)
Calcium: 9.8 mg/dL (ref 8.8–10.2)
Chloride: 102 mmol/L (ref 98–107)
Creatinine: 0.9 mg/dL (ref 0.7–1.3)
Est, Glom Filt Rate: 91 mL/min/1.73m (ref >=60)
Glucose: 87 mg/dL (ref 70–99)
OSMOLALITY CALCULATED: 284 mOsm/kg (ref 270–287)
Potassium: 4.2 mmol/L (ref 3.5–5.3)
Sodium: 143 mmol/L (ref 135–145)

## 2022-06-24 LAB — MAGNESIUM: Magnesium: 1.9 mg/dL (ref 1.6–2.6)

## 2022-06-24 LAB — CK: Total CK: 188 U/L (ref 20–200)

## 2022-06-25 ENCOUNTER — Ambulatory Visit
Admit: 2022-06-25 | Discharge: 2022-06-25 | Payer: MEDICARE | Attending: Physician Assistant | Primary: Internal Medicine

## 2022-06-25 DIAGNOSIS — G2581 Restless legs syndrome: Secondary | ICD-10-CM

## 2022-06-25 LAB — SEDIMENTATION RATE: Sed Rate: 16 mm/hr (ref 0–20)

## 2022-06-25 NOTE — Progress Notes (Unsigned)
CHIEF COMPLAINT:  Chief Complaint   Patient presents with    Dysmenorrhea        HISTORY OF PRESENT ILLNESS:  [name] is a 71 y.o. male  who presents today for muscle cramps    Foot - only at night will feel like L foot is "folding up long ways."  Foot hurts during the day but not severe.  11/10 at night.  3/10 with walking.  No known injury.  For 2 weeks.  Feels he has to use his other foot to "mash it down."  It will then feel better immediately.  Feels it will swell up at times.  Denies erythema or warmth.      Muscle cramps -- worse after working outside.  Both legs.  Not on statin.  States he moves his legs all night.  Denies h/o OSA.  Mostly in calves.    4-5 nights per week.  Does not want medication.     BP - states at home it is 130s/70s.      Labs reviewed and unremarkable.      Denies HA, vision changes, CP, SOB, edema, changes in bowel/bladder habits, saddle anesthesia, skin changes.  No other questions or concerns.    PHQ:      03/30/2022     9:31 AM   PHQ-9    Little interest or pleasure in doing things 0   Feeling down, depressed, or hopeless 0   PHQ-2 Score 0   PHQ-9 Total Score 0       CURRENT MEDICATION LIST:    Current Outpatient Medications   Medication Sig Dispense Refill    ALPRAZolam (XANAX) 0.5 MG tablet Take 0.5 tablet by mouth three times per day 45 tablet 5    cyclobenzaprine (FLEXERIL) 10 MG tablet 0.5 tab in am and 1 tab at bedtime 20 tablet 0    Bacillus Coagulans-Inulin (PROBIOTIC FORMULA PO) Take 1 tablet by mouth daily       No current facility-administered medications for this visit.        ALLERGIES:    Allergies   Allergen Reactions    Aspirin Effervescent      Other reaction(s): Itch    Chlorphen-Phenyleph-Asa Itching     Event:      Metoprolol Tartrate Itching     Event:          HISTORY:  Past Medical History:   Diagnosis Date    Abdominal wall ulcer (HCC)     Anxiety     Crohn's disease ([place])     Diarrhea     Gallstones     GI disease     Hypertension     Joint pain      Nausea     Pneumonia     Vomiting       Past Surgical History:   Procedure Laterality Date    CHOLECYSTECTOMY, LAPAROSCOPIC  01/03/2015    COLECTOMY  1982    OTHER SURGICAL HISTORY  04/10/2019    laparoscopic robotic assisted redo ileocolic resection with primary antiperistaltic extracorporeal anastomosis, omental nodule biopsy ([facility])      Social History     Socioeconomic History    Marital status: Unknown     Spouse name: Not on file    Number of children: Not on file    Years of education: Not on file    Highest education level: Not on file   Occupational History    Not on file   Tobacco  Use    Smoking status: Every Day     Packs/day: 0.50     Years: 30.00     Additional pack years: 0.00     Total pack years: 15.00     Types: Cigarettes    Smokeless tobacco: Never   Vaping Use    Vaping Use: Never used   Substance and Sexual Activity    Alcohol use: Not Currently    Drug use: Yes     Types: Marijuana Sheran Fava)    Sexual activity: Not on file   Other Topics Concern    Not on file   Social History Narrative    Not on file     Social Determinants of Health     Financial Resource Strain: Not on file   Food Insecurity: Not on file   Transportation Needs: Not on file   Physical Activity: Insufficiently Active (03/30/2022)    Exercise Vital Sign     Days of Exercise per Week: 7 days     Minutes of Exercise per Session: 20 min   Stress: Not on file   Social Connections: Not on file   Intimate Partner Violence: Not on file   Housing Stability: Not on file      Family History   Problem Relation Age of Onset    Diabetes Father     Hypertension Father     Lung Cancer Father 37    Diabetes Brother     Hypertension Brother     Liver Cancer Brother     Arthritis Daughter         REVIEW OF SYSTEMS:  Pertinent items are noted in HPI.    PHYSICAL EXAM:  Vital Signs -   Visit Vitals  BP (!) 181/101   Pulse 85   Ht 5\' 11"  (1.803 m)   Wt 183 lb 11.2 oz (83.3 kg)   SpO2 98%   BMI 25.62 kg/m    Body mass index is 25.62 kg/m.   General  Appearance:  awake, alert, oriented, in no acute distress and well developed, well nourished  Eyes:  PERRL, EOMI, and Sclera nonicteric  Ears:  canals and TMs NI  Nose/Sinuses:  Nares normal. Septum midline. Mucosa normal. No drainage or sinus tenderness.  Mouth/Throat:  Mucosa moist.  No lesions.  Pharynx without erythema, edema or exudate.  Lungs:  Normal expansion.  Clear to auscultation.  No rales, rhonchi, or wheezing.  Heart:  Heart sounds are normal.  Regular rate and rhythm without murmur, gallop or rub.  Abdomen:  Soft, non-tender, normal bowel sounds.  No bruits, organomegaly or masses.  Extremities: Extremities warm to touch, pink, with no edema. and pulses present in all extremities  Musculoskeletal:  Range of motion normal in hips, knees, shoulders, and spine.  Neurologic:  Gait normal. Reflexes normal and symmetric. Sensation grossly intact.    ***    LABS  No results found for this visit on 06/25/22.  Orders Only on 06/24/2022   Component Date Value Ref Range Status    SED RATE METHOD 06/24/2022 Automated   Final    NOTE: [employer] iSED Methodology , effective 03-01-2017    Sed Rate 06/24/2022 16  0 - 20 mm/hr Final    Magnesium 06/24/2022 1.9  1.6 - 2.6 mg/dL Final    Sodium 06/26/2022 143  135 - 145 mmol/L Final    Potassium 06/24/2022 4.2  3.5 - 5.3 mmol/L Final    Chloride 06/24/2022 102  98 - 107 mmol/L  Final    CO2 06/24/2022 28  22 - 29 mmol/L Final    Glucose 06/24/2022 87  70 - 99 mg/dL Final    BUN 16/06/9603 13  8 - 23 mg/dL Final    Creatinine 54/05/8118 0.9  0.7 - 1.3 mg/dL Final    Anion Gap 14/78/2956 13  2 - 17 mmol/L Final    OSMOLALITY CALCULATED 06/24/2022 284  270 - 287 mOsm/kg Final    Calcium 06/24/2022 9.8  8.8 - 10.2 mg/dL Final    Est, Glom Filt Rate 06/24/2022 91  >=60 mL/min/1.46m Final    Comment: VERIFIED by Discern Expert.  GFR Interpretation:                                                                         % OF  KIDNEY  GFR                                                         STAGE  FUNCTION  ==================================================================================    > 90        Normal kidney function                       STAGE 1  90-100%  89 to 60      Mild loss of kidney function                 STAGE 2  80-60%  59 to 45      Mild to moderate loss of kidney function     STAGE 3a  59-45%  44 to 30      Moderate to severe loss of kidney function   STAGE 3b  44-30%  29 to 15      Severe loss of kidney function               STAGE 4  29-15%    < 15        Kidney failure                               STAGE 5  <15%  ==================================================================================  Modified from [employer]    GFR Calculation performed using the CKD-EPI 2021 equation developed for use  with IDMS traceable creatinine methods and                            is the calculation recommended by  the [facility] for estimating GFR in adults.      Total CK 06/24/2022 188  20 - 200 unit/L Final       IMPRESSION/PLAN    There are no diagnoses linked to this encounter.      Follow up and Dispositions:  No follow-ups on file.       Lennox Pippins, [place]

## 2022-06-26 LAB — ALDOLASE: Aldolase: 4.8 U/L (ref 3.3–10.3)

## 2022-06-29 ENCOUNTER — Inpatient Hospital Stay: Admit: 2022-06-29 | Payer: MEDICARE | Primary: Internal Medicine

## 2022-06-29 DIAGNOSIS — M79672 Pain in left foot: Secondary | ICD-10-CM

## 2022-07-01 ENCOUNTER — Ambulatory Visit: Admit: 2022-07-01 | Discharge: 2022-07-01 | Payer: MEDICARE | Attending: Internal Medicine | Primary: Internal Medicine

## 2022-07-01 DIAGNOSIS — Z2821 Immunization not carried out because of patient refusal: Secondary | ICD-10-CM

## 2022-07-01 NOTE — Progress Notes (Signed)
CHIEF COMPLAINT:  Chief Complaint   Patient presents with    Follow-up        HISTORY OF PRESENT ILLNESS:  [name] is a 71 y.o. male  who presents to the office secondary to muscle cramps.  He relates he has had cramps off and on.  His wife was in the office the other day and let me know that he was complaining of myalgias and muscle cramps.  He relates Crohn's disease seems to be under pretty good control.  Arthritis is stable.  He has chronic pain.    He denies fevers, chills, cough.    Labs for cramps were ordered, completed and reviewed and are unremarkable including normal serum potassium, magnesium, CPK and aldolase levels.    PHQ:      03/30/2022     9:31 AM   PHQ-9    Little interest or pleasure in doing things 0   Feeling down, depressed, or hopeless 0   PHQ-2 Score 0   PHQ-9 Total Score 0       CURRENT MEDICATION LIST:    Current Outpatient Medications   Medication Sig Dispense Refill    ALPRAZolam (XANAX) 0.5 MG tablet Take 0.5 tablet by mouth three times per day 45 tablet 5    cyclobenzaprine (FLEXERIL) 10 MG tablet 0.5 tab in am and 1 tab at bedtime 20 tablet 0    Bacillus Coagulans-Inulin (PROBIOTIC FORMULA PO) Take 1 tablet by mouth daily       No current facility-administered medications for this visit.        ALLERGIES:    Allergies   Allergen Reactions    Aspirin Effervescent      Other reaction(s): Itch    Chlorphen-Phenyleph-Asa Itching     Event:      Metoprolol Tartrate Itching     Event:          HISTORY:  Past Medical History:   Diagnosis Date    Abdominal wall ulcer (HCC)     Anxiety     Crohn's disease ([place])     Diarrhea     Gallstones     GI disease     Hypertension     Joint pain     Nausea     Pneumonia     Vomiting       Past Surgical History:   Procedure Laterality Date    CHOLECYSTECTOMY, LAPAROSCOPIC  01/03/2015    COLECTOMY  1982    OTHER SURGICAL HISTORY  04/10/2019    laparoscopic robotic assisted redo ileocolic resection with primary antiperistaltic extracorporeal  anastomosis, omental nodule biopsy ([facility]ES)      Social History     Socioeconomic History    Marital status: Unknown     Spouse name: Not on file    Number of children: Not on file    Years of education: Not on file    Highest education level: Not on file   Occupational History    Not on file   Tobacco Use    Smoking status: Every Day     Packs/day: 0.50     Years: 30.00     Additional pack years: 0.00     Total pack years: 15.00     Types: Cigarettes    Smokeless tobacco: Never   Vaping Use    Vaping Use: Never used   Substance and Sexual Activity    Alcohol use: Not Currently    Drug use: Yes  Types: Marijuana Sheran Fava)    Sexual activity: Not on file   Other Topics Concern    Not on file   Social History Narrative    Not on file     Social Determinants of Health     Financial Resource Strain: Not on file   Food Insecurity: Not on file   Transportation Needs: Not on file   Physical Activity: Insufficiently Active (03/30/2022)    Exercise Vital Sign     Days of Exercise per Week: 7 days     Minutes of Exercise per Session: 20 min   Stress: Not on file   Social Connections: Not on file   Intimate Partner Violence: Not on file   Housing Stability: Not on file      Family History   Problem Relation Age of Onset    Diabetes Father     Hypertension Father     Lung Cancer Father 31    Diabetes Brother     Hypertension Brother     Liver Cancer Brother     Arthritis Daughter         REVIEW OF SYSTEMS:  Pertinent items are noted in HPI.    PHYSICAL EXAM:  Vital Signs -   Visit Vitals  BP 135/88   Pulse 99   Ht 5\' 11"  (1.803 m)   Wt 183 lb 11.2 oz (83.3 kg)   SpO2 97%   BMI 25.62 kg/m    Body mass index is 25.62 kg/m.   General Appearance:  awake, alert, oriented, in no acute distress and well developed, well nourished  Lungs:  Normal expansion.  Clear to auscultation.  No rales, rhonchi, or wheezing.  Heart:  Heart sounds are normal.  Regular rate and rhythm without murmur, gallop or rub.  Abdomen:  Soft, non-tender,  normal bowel sounds.  No bruits, organomegaly or masses.  Extremities: Extremities warm to touch, pink, with no edema. and pulses present in all extremities  Musculoskeletal:  Range of motion normal in hips, knees, shoulders, and spine.  Neurologic:  Gait normal. Reflexes normal and symmetric. Sensation grossly intact.      LABS  No results found for this visit on 07/01/22.  Orders Only on 06/24/2022   Component Date Value Ref Range Status    SED RATE METHOD 06/24/2022 Automated   Final    NOTE: [employer] iSED Methodology , effective 03-01-2017    Sed Rate 06/24/2022 16  0 - 20 mm/hr Final    Aldolase 06/24/2022 4.8  3.3 - 10.3 unit/L Final    Comment: Performed At: [facility]  [address] [place], [place]  [id number] MD Jolene Schimke      Magnesium 06/24/2022 1.9  1.6 - 2.6 mg/dL Final    Sodium 06/26/2022 143  135 - 145 mmol/L Final    Potassium 06/24/2022 4.2  3.5 - 5.3 mmol/L Final    Chloride 06/24/2022 102  98 - 107 mmol/L Final    CO2 06/24/2022 28  22 - 29 mmol/L Final    Glucose 06/24/2022 87  70 - 99 mg/dL Final    BUN 06/26/2022 13  8 - 23 mg/dL Final    Creatinine 29/92/4268 0.9  0.7 - 1.3 mg/dL Final    Anion Gap 34/19/6222 13  2 - 17 mmol/L Final    OSMOLALITY CALCULATED 06/24/2022 284  270 - 287 mOsm/kg Final    Calcium 06/24/2022 9.8  8.8 - 10.2 mg/dL Final    Est, Glom Filt Rate  06/24/2022 91  >=60 mL/min/1.36m Final    Comment: VERIFIED by Discern Expert.  GFR Interpretation:                                                                         % OF  KIDNEY  GFR                                                        STAGE  FUNCTION  ==================================================================================    > 90        Normal kidney function                       STAGE 1  90-100%  89 to 60      Mild loss of kidney function                 STAGE 2  80-60%  59 to 45      Mild to moderate loss of kidney function     STAGE 3a  59-45%  44 to 30      Moderate to severe  loss of kidney function   STAGE 3b  44-30%  29 to 15      Severe loss of kidney function               STAGE 4  29-15%    < 15        Kidney failure                               STAGE 5  <15%  ==================================================================================  Modified from [employer] [facility]    GFR Calculation performed using the CKD-EPI 2021 equation developed for use  with IDMS traceable creatinine methods and                            is the calculation recommended by  the [facility] for estimating GFR in adults.      Total CK 06/24/2022 188  20 - 200 unit/L Final       IMPRESSION/PLAN    1. Influenza vaccination declined  Declined influenza vaccine    2. Herpes zoster vaccination declined  Shingles vaccine recommended    3. Arthritis  Continue current regimen    4. Cramps, muscle, general  Labs reviewed.  No other cause found for cramps.    5. 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 Essential (primary) hypertension  Blood pressure normal on second check        Follow up and Dispositions:  No follow-ups on file.       Clerance Lav, MD

## 2022-07-07 ENCOUNTER — Ambulatory Visit
Admit: 2022-07-07 | Discharge: 2022-07-07 | Payer: MEDICARE | Attending: Physician Assistant | Primary: Internal Medicine

## 2022-07-07 DIAGNOSIS — M545 Low back pain, unspecified: Secondary | ICD-10-CM

## 2022-07-07 LAB — AMB POC URINALYSIS DIP STICK MANUAL W/O MICRO
Bilirubin, Urine, POC: NEGATIVE
Glucose, Urine, POC: NEGATIVE
Ketones, Urine, POC: NEGATIVE
Leukocyte Esterase, Urine, POC: NEGATIVE
Nitrite, Urine, POC: NEGATIVE
Specific Gravity, Urine, POC: 1.015 (ref 1.001–1.035)
Urobilinogen, POC: 0.2
pH, Urine, POC: 6 (ref 4.6–8.0)

## 2022-07-07 MED ORDER — METHYLPREDNISOLONE SODIUM SUCC 125 MG IJ SOLR
125 MG | Freq: Once | INTRAMUSCULAR | Status: AC
Start: 2022-07-07 — End: 2022-07-07
  Administered 2022-07-07: 15:00:00 125 mg via INTRAMUSCULAR

## 2022-07-07 MED ORDER — CYCLOBENZAPRINE HCL 10 MG PO TABS
10 MG | ORAL_TABLET | ORAL | 0 refills | Status: AC
Start: 2022-07-07 — End: ?

## 2022-07-07 MED ORDER — METHYLPREDNISOLONE 4 MG PO TBPK
4 MG | PACK | ORAL | 0 refills | Status: AC
Start: 2022-07-07 — End: 2022-07-13

## 2022-07-07 NOTE — Progress Notes (Unsigned)
CHIEF COMPLAINT:  Chief Complaint   Patient presents with    Back Pain        HISTORY OF PRESENT ILLNESS:  [name] is a 71 y.o. male  who presents today for back pain and blood pressure check.    He has back pain for about 1 week.  He was having pain at his last appt - felt he pulled a muscle in his back from working in the yard.  No known injury.  States he is getting better at this time.  Pain is worse with moving around/lifting things/twisting.  5/10 at baseline then 10/10 with movement.  Pain is constant.  He has taken IBU and APAP without much relief.  He states he has dysuria at times.  Denies urinary frequency, urinary incontinence, flank pain, perineal pain.  Denies saddle anesthesia, change in bowel/bladder habits, weakness of lower extremities.  Declines CT or XR of KUB to r/o kidney stone.  Declines XR of back, as well.  Has had cyclobenzaprine in the past and tolerated it well.      Foot pain -     BP     Denies fever, CP, SOB, edema, changes in bowel/bladder habits, skin changes.  No other questions or concerns.    PHQ:      03/30/2022     9:31 AM   PHQ-9    Little interest or pleasure in doing things 0   Feeling down, depressed, or hopeless 0   PHQ-2 Score 0   PHQ-9 Total Score 0       CURRENT MEDICATION LIST:    Current Outpatient Medications   Medication Sig Dispense Refill    acetaminophen (TYLENOL) 325 MG tablet Take 2 tablets by mouth every 6 hours as needed for Pain PRN      ibuprofen (ADVIL;MOTRIN) 400 MG tablet Take 1 tablet by mouth every 6 hours as needed for Pain PRN      cyclobenzaprine (FLEXERIL) 10 MG tablet 0.5 tab in am and 1 tab at bedtime 20 tablet 0    methylPREDNISolone (MEDROL DOSEPACK) 4 MG tablet Take by mouth. 1 kit 0    ALPRAZolam (XANAX) 0.5 MG tablet Take 0.5 tablet by mouth three times per day 45 tablet 5    Bacillus Coagulans-Inulin (PROBIOTIC FORMULA PO) Take 1 tablet by mouth daily       No current facility-administered medications for this visit.         ALLERGIES:    Allergies   Allergen Reactions    Aspirin Effervescent      Other reaction(s): Itch    Chlorphen-Phenyleph-Asa Itching     Event:      Metoprolol Tartrate Itching     Event:          HISTORY:  Past Medical History:   Diagnosis Date    Abdominal wall ulcer ([place])     Anxiety     Crohn's disease ([place])     Diarrhea     Gallstones     GI disease     Hypertension     Joint pain     Nausea     Pneumonia     Vomiting       Past Surgical History:   Procedure Laterality Date    CHOLECYSTECTOMY, LAPAROSCOPIC  01/03/2015    COLECTOMY  1982    OTHER SURGICAL HISTORY  04/10/2019    laparoscopic robotic assisted redo ileocolic resection with primary antiperistaltic extracorporeal anastomosis, omental nodule biopsy (LAGARES)  Social History     Socioeconomic History    Marital status: Unknown     Spouse name: Not on file    Number of children: Not on file    Years of education: Not on file    Highest education level: Not on file   Occupational History    Not on file   Tobacco Use    Smoking status: Every Day     Packs/day: 0.50     Years: 30.00     Additional pack years: 0.00     Total pack years: 15.00     Types: Cigarettes    Smokeless tobacco: Never   Vaping Use    Vaping Use: Never used   Substance and Sexual Activity    Alcohol use: Not Currently    Drug use: Yes     Types: Marijuana Sherrie Mustache)    Sexual activity: Not on file   Other Topics Concern    Not on file   Social History Narrative    Not on file     Social Determinants of Health     Financial Resource Strain: Not on file   Food Insecurity: Not on file   Transportation Needs: Not on file   Physical Activity: Insufficiently Active (03/30/2022)    Exercise Vital Sign     Days of Exercise per Week: 7 days     Minutes of Exercise per Session: 20 min   Stress: Not on file   Social Connections: Not on file   Intimate Partner Violence: Not on file   Housing Stability: Not on file      Family History   Problem Relation Age of Onset    Diabetes Father      Hypertension Father     Lung Cancer Father 39    Diabetes Brother     Hypertension Brother     Liver Cancer Brother     Arthritis Daughter         REVIEW OF SYSTEMS:  Pertinent items are noted in HPI.    PHYSICAL EXAM:  Vital Signs -   Visit Vitals  BP (!) 187/104   Pulse (!) 102   Ht '5\' 11"'  (1.803 m)   Wt 182 lb 12.8 oz (82.9 kg)   SpO2 97%   BMI 25.50 kg/m    Body mass index is 25.5 kg/m.   General Appearance:  awake, alert, oriented, in no acute distress and well developed, well nourished  Eyes:  PERRL, EOMI, and Sclera nonicteric  Ears:  canals and TMs NI  Nose/Sinuses:  Nares normal. Septum midline. Mucosa normal. No drainage or sinus tenderness.  Mouth/Throat:  Mucosa moist.  No lesions.  Pharynx without erythema, edema or exudate.  Lungs:  Normal expansion.  Clear to auscultation.  No rales, rhonchi, or wheezing.  Heart:  Heart sounds are normal.  Regular rate and rhythm without murmur, gallop or rub.  Abdomen:  Soft, non-tender, normal bowel sounds.  No bruits, organomegaly or masses.  Extremities: Extremities warm to touch, pink, with no edema. and pulses present in all extremities  Musculoskeletal:  Range of motion normal in hips, knees, shoulders, and spine.  Neurologic:  Gait normal. Reflexes normal and symmetric. Sensation grossly intact.    ***    LABS  Results for orders placed or performed in visit on 07/07/22   POC Urinalysis Nonauto w/o Scope (44818)   Result Value Ref Range    Color (UA POC) Amber     Clarity (UA POC)  Clear     Glucose, Urine, POC Negative Negative    Bilirubin, Urine, POC Negative Negative    Ketones, Urine, POC Negative Negative    Specific Gravity, Urine, POC 1.015 1.001 - 1.035    Blood (UA POC) Non-Hemolyzed Trace Negative    pH, Urine, POC 6.0 4.6 - 8.0    Protein, Urine, POC Trace Negative    Urobilinogen, POC 0.2 mg/dL     Nitrite, Urine, POC Negative Negative    Leukocyte Esterase, Urine, POC Negative Negative     Orders Only on 06/24/2022   Component Date Value Ref  Range Status    SED RATE METHOD 06/24/2022 Automated   Final    NOTE: [employer] iSED Methodology , effective 03-01-2017    Sed Rate 06/24/2022 16  0 - 20 mm/hr Final    Aldolase 06/24/2022 4.8  3.3 - 10.3 unit/L Final    Comment: Performed At: [facility]  [place], [place] [id number]  Rush Farmer MD UX:[id number]      Magnesium 06/24/2022 1.9  1.6 - 2.6 mg/dL Final    Sodium 06/24/2022 143  135 - 145 mmol/L Final    Potassium 06/24/2022 4.2  3.5 - 5.3 mmol/L Final    Chloride 06/24/2022 102  98 - 107 mmol/L Final    CO2 06/24/2022 28  22 - 29 mmol/L Final    Glucose 06/24/2022 87  70 - 99 mg/dL Final    BUN 06/24/2022 13  8 - 23 mg/dL Final    Creatinine 06/24/2022 0.9  0.7 - 1.3 mg/dL Final    Anion Gap 06/24/2022 13  2 - 17 mmol/L Final    OSMOLALITY CALCULATED 06/24/2022 284  270 - 287 mOsm/kg Final    Calcium 06/24/2022 9.8  8.8 - 10.2 mg/dL Final    Est, Glom Filt Rate 06/24/2022 91  >=60 mL/min/1.70mFinal    Comment: VERIFIED by Discern Expert.  GFR Interpretation:                                                                         % OF  KIDNEY  GFR                                                        STAGE  FUNCTION  ==================================================================================    > 90        Normal kidney function                       STAGE 1  90-100%  89 to 60      Mild loss of kidney function                 STAGE 2  80-60%  59 to 45      Mild to moderate loss of kidney function     STAGE 3a  59-45%  44 to 30      Moderate to severe loss of kidney function   STAGE 3b  44-30%  29 to 15  Severe loss of kidney function               STAGE 4  29-15%    < 15        Kidney failure                               STAGE 5  <15%  ==================================================================================  Modified from [place]    GFR Calculation performed using the CKD-EPI 2021 equation developed for use  with IDMS traceable creatinine  methods and                            is the calculation recommended by  the [facility] for estimating GFR in adults.      Total CK 06/24/2022 188  20 - 200 unit/L Final       IMPRESSION/PLAN    1. Low back pain, unspecified back pain laterality, unspecified chronicity, unspecified whether sciatica present  ***  - POC Urinalysis Nonauto w/o Scope (81002)    2. Hematuria, unspecified type  ***  - Culture, Urine; Future    3. Left foot pain  ***    4. Pulled muscle  ***    5. Essential (primary) hypertension  ***        Follow up and Dispositions:  No follow-ups on file.       Vonzell Schlatter, [place]

## 2022-07-08 ENCOUNTER — Encounter

## 2022-07-08 LAB — CULTURE, URINE: FINAL REPORT: NO GROWTH

## 2022-07-27 ENCOUNTER — Telehealth

## 2022-07-27 NOTE — Telephone Encounter (Signed)
Ordered the x-ray and called and let the patient know

## 2022-07-27 NOTE — Telephone Encounter (Signed)
Patient states that he thinks that his left foot is fractured. He states that he can't apply any pressure to that foot. He will like to have a order to have a xray on it. Please call and advise.    Sent to B.Balinda Quails

## 2022-07-27 NOTE — Telephone Encounter (Signed)
Xr left foot.  pain

## 2022-07-28 ENCOUNTER — Inpatient Hospital Stay: Admit: 2022-07-28 | Payer: MEDICARE | Primary: Internal Medicine

## 2022-07-28 DIAGNOSIS — M79672 Pain in left foot: Secondary | ICD-10-CM

## 2022-07-29 NOTE — Telephone Encounter (Signed)
I let patient know. He wants to know what he should do next? He can barely walk

## 2022-07-29 NOTE — Telephone Encounter (Signed)
Patient said he will to hold off on a appointment for now. He will call us if things get worse

## 2022-07-29 NOTE — Telephone Encounter (Signed)
Spoke with wife, she will like a call back with the results from patients xray. Please call and advise.     Sent to B.Balinda Quails

## 2022-07-29 NOTE — Telephone Encounter (Signed)
Tell her x-rays were negative.  No evidence of fracture.  Somebody should have called them yesterday.

## 2022-07-29 NOTE — Telephone Encounter (Signed)
Have him come in and see Benjamine Mola.  He may well need to be seen by orthopedics but lets see him here first

## 2022-08-19 ENCOUNTER — Encounter

## 2022-08-19 NOTE — Telephone Encounter (Signed)
Requested Prescriptions     Pending Prescriptions Disp Refills    ALPRAZolam (XANAX) 0.5 MG tablet 45 tablet 5     Sig: Take 0.5 tablet by mouth three times per day        Pharmacy confirmed    On board for Dr Grandville Silos and sent to [facility]

## 2022-08-19 NOTE — Telephone Encounter (Signed)
Last filled 03/30/22 #45

## 2022-08-23 MED ORDER — ALPRAZOLAM 0.5 MG PO TABS
0.5 MG | ORAL_TABLET | ORAL | 5 refills | Status: AC
Start: 2022-08-23 — End: 2024-08-19

## 2022-08-23 NOTE — Telephone Encounter (Signed)
Sent in.  I let him know

## 2022-08-25 ENCOUNTER — Encounter

## 2022-08-25 MED ORDER — ZOLPIDEM TARTRATE 5 MG PO TABS
5 MG | ORAL_TABLET | Freq: Every evening | ORAL | 0 refills | Status: DC
Start: 2022-08-25 — End: 2022-11-17

## 2022-08-25 NOTE — Telephone Encounter (Signed)
Last filled 10/13/21 #330,30 day supply

## 2022-08-25 NOTE — Telephone Encounter (Signed)
I sent in Ambien.  Let him know

## 2022-08-25 NOTE — Telephone Encounter (Signed)
REFILL    AMBIEN 0.5 MG 1 TABLET AT NIGHT.     Patient has been having to take 2 at night recently.     Spouse does not want 1 mg pills.     She would like a new prescription for Ambien 0.5 mg 2 tablets at bedtime.

## 2022-10-13 ENCOUNTER — Encounter: Payer: MEDICARE | Attending: Internal Medicine | Primary: Internal Medicine

## 2022-10-23 ENCOUNTER — Inpatient Hospital Stay: Admit: 2022-10-23 | Discharge: 2022-10-23 | Payer: MEDICARE | Primary: Internal Medicine

## 2022-10-23 DIAGNOSIS — R109 Unspecified abdominal pain: Secondary | ICD-10-CM

## 2022-10-23 LAB — CBC WITH AUTO DIFFERENTIAL
Absolute Baso #: 0 10*3/uL (ref 0.0–0.2)
Absolute Eos #: 0.2 10*3/uL (ref 0.0–0.5)
Absolute Lymph #: 1.7 10*3/uL (ref 1.0–3.2)
Absolute Mono #: 0.7 10*3/uL (ref 0.3–1.0)
Basophils %: 0.5 % (ref 0.0–2.0)
Eosinophils %: 2.7 % (ref 0.0–7.0)
Hematocrit: 41.7 % (ref 38.0–52.0)
Hemoglobin: 13.6 g/dL (ref 13.0–17.3)
Immature Grans (Abs): 0.01 10*3/uL (ref 0.00–0.06)
Immature Granulocytes: 0.1 % (ref 0.0–0.6)
Lymphocytes: 21.6 % (ref 15.0–45.0)
MCH: 31.6 pg (ref 27.0–34.5)
MCHC: 32.6 g/dL (ref 32.0–36.0)
MCV: 96.8 fL (ref 84.0–100.0)
MPV: 9.4 fL (ref 7.2–13.2)
Monocytes: 8.5 % (ref 4.0–12.0)
Neutrophils %: 66.6 % (ref 42.0–74.0)
Neutrophils Absolute: 5.4 10*3/uL (ref 1.6–7.3)
Platelets: 330 10*3/uL (ref 140–440)
RBC: 4.31 x10e6/mcL (ref 4.00–5.60)
RDW: 13.1 % (ref 11.0–16.0)
WBC: 8 10*3/uL (ref 3.8–10.6)

## 2022-10-23 LAB — BASIC METABOLIC PANEL
Anion Gap: 10 mmol/L (ref 2–17)
BUN: 12 mg/dL (ref 8–23)
CO2: 25 mmol/L (ref 22–29)
Calcium: 8.7 mg/dL — ABNORMAL LOW (ref 8.8–10.2)
Chloride: 108 mmol/L — ABNORMAL HIGH (ref 98–107)
Creatinine: 0.9 mg/dL (ref 0.7–1.3)
Est, Glom Filt Rate: 91 mL/min/1.73m (ref >=60)
Glucose: 107 mg/dL — ABNORMAL HIGH (ref 70–99)
OSMOLALITY CALCULATED: 285 mOsm/kg (ref 270–287)
Potassium: 3.8 mmol/L (ref 3.5–5.3)
Sodium: 143 mmol/L (ref 135–145)

## 2022-10-23 LAB — C-REACTIVE PROTEIN: CRP: 1.4 mg/dL — ABNORMAL HIGH (ref 0.0–0.5)

## 2022-10-29 ENCOUNTER — Telehealth

## 2022-10-29 MED ORDER — PREDNISONE 10 MG PO TABS
10 MG | ORAL_TABLET | ORAL | 0 refills | Status: AC
Start: 2022-10-29 — End: ?

## 2022-10-29 NOTE — Telephone Encounter (Signed)
Loaded in  Please send in

## 2022-11-17 ENCOUNTER — Encounter

## 2022-11-17 MED ORDER — ZOLPIDEM TARTRATE 5 MG PO TABS
5 MG | ORAL_TABLET | Freq: Every evening | ORAL | 5 refills | Status: AC
Start: 2022-11-17 — End: 2023-11-18

## 2022-11-17 NOTE — Telephone Encounter (Signed)
I sent in Ambien.  Let him know

## 2022-11-17 NOTE — Telephone Encounter (Signed)
[facility] website checked today  Zolpidem 5 mg last filled on 08/25/2022 # 60 for a 30 day supply  Sent to Dr Graylon Good

## 2022-11-17 NOTE — Telephone Encounter (Signed)
Requested Prescriptions     Pending Prescriptions Disp Refills    zolpidem (AMBIEN) 5 MG tablet 60 tablet 0     Sig: Take 2 tablets by mouth nightly. Max Daily Amount: 10 mg     [id number] [phone]    Sent to [facility]

## 2022-11-18 ENCOUNTER — Encounter

## 2022-11-18 MED ORDER — ZOLPIDEM TARTRATE 10 MG PO TABS
10 | ORAL_TABLET | Freq: Every evening | ORAL | 1 refills | Status: DC | PRN
Start: 2022-11-18 — End: 2023-04-07

## 2022-11-18 NOTE — Telephone Encounter (Signed)
Loaded in the Zolpidem 10 mg  Please send in

## 2022-11-18 NOTE — Telephone Encounter (Addendum)
Please advise     -- Pt's wife called stating that the [facility] prescription that was sent yesterday has to be resent, due to the pt's insurance company only being able to cover the 10 MG tablet     Callback nu.  [phone]    [id number] [phone]    Sent to Agnes Lawrence

## 2022-11-18 NOTE — Telephone Encounter (Signed)
I sent in Ambien.  Let him know

## 2023-04-07 ENCOUNTER — Encounter

## 2023-04-07 MED ORDER — ALPRAZOLAM 0.5 MG PO TABS
0.5 | ORAL_TABLET | ORAL | 5 refills | Status: DC
Start: 2023-04-07 — End: 2023-05-10

## 2023-04-07 MED ORDER — ZOLPIDEM TARTRATE 10 MG PO TABS
10 MG | ORAL_TABLET | Freq: Every evening | ORAL | 1 refills | Status: AC | PRN
Start: 2023-04-07 — End: 2023-10-04

## 2023-04-07 NOTE — Telephone Encounter (Signed)
I sent in prescriptions.  Let him know

## 2023-04-07 NOTE — Telephone Encounter (Signed)
Refills      Requested Prescriptions     Pending Prescriptions Disp Refills    ALPRAZolam (XANAX) 0.5 MG tablet 45 tablet 5     Sig: Take 0.5 tablet by mouth three times per day    zolpidem (AMBIEN) 10 MG tablet 90 tablet 1     Sig: Take 1 tablet by mouth nightly as needed for Sleep for up to 180 days. Max Daily Amount: 10 mg        [facility]  [phone]      Sent to Courtney Heys

## 2023-04-07 NOTE — Telephone Encounter (Signed)
[facility] website checked today  Alprazolam 0.5 mg last filled on 11/17/2022 # 45 for a 30 day supply  Zolpidem 10 mg last filled on 11/18/2022 # 90 for a 90 day supply    Last seen: 07/07/2022  Sch: 04/22/2023    Sent to Dr Ilsa Iha

## 2023-04-08 NOTE — Telephone Encounter (Signed)
Please call and discuss the best dates the pt is able to come in for, stating that he will be available on the 26th of month.     Callback nu. [phone]    Sent to Courtney Heys

## 2023-04-09 NOTE — Telephone Encounter (Addendum)
Pt wife changed appt on 7/25 to 1130 am

## 2023-04-14 ENCOUNTER — Encounter: Attending: Internal Medicine | Primary: Internal Medicine

## 2023-04-22 ENCOUNTER — Encounter: Payer: MEDICARE | Attending: Internal Medicine | Primary: Internal Medicine

## 2023-05-07 ENCOUNTER — Ambulatory Visit: Admit: 2023-05-07 | Discharge: 2023-05-07 | Payer: MEDICARE | Attending: Internal Medicine | Primary: Internal Medicine

## 2023-05-07 DIAGNOSIS — Z0000 Encounter for general adult medical examination without abnormal findings: Secondary | ICD-10-CM

## 2023-05-07 MED ORDER — RSVPREF3 VAC RECOMB ADJUVANTED 120 MCG/0.5ML IM SUSR
120 | Freq: Once | INTRAMUSCULAR | 0 refills | Status: AC
Start: 2023-05-07 — End: 2023-05-07

## 2023-05-07 NOTE — Progress Notes (Signed)
Ambien last filled 04/07/23 #90  Alprazolam last filled 11/17/22 #45

## 2023-05-07 NOTE — Progress Notes (Unsigned)
CHIEF COMPLAINT:  Chief Complaint   Patient presents with    Medicare AWV     Pain in abdomen at night states its a 8. Wants to discuss adderall        HISTORY OF PRESENT ILLNESS:  [name] is a 72 y.o. male  who presents to the office for an annual wellness visit and medically necessary follow-up.  He relates he is having a tough time.  He had chronic Crohn's disease with abdominal pain.  He had previous surgeries with inflammatory mass removed.  He is followed by [facility], Dr. Durwin Glaze.  He has tried multiple biologic agents over the years including Humira, Cimzia.  He does get good relief with prednisone.  He relates every time he eats she has abdominal pain.  He has been getting Adderall from his neighbor because it gives him some energy to get moving in the morning and it decreases his appetite so he does not want to eat and therefore will not have as much abdominal pain.  He requested that I give him prescription for Adderall.  I told him I was uncomfortable with that and I was more comfortable with getting an independent opinion from the [facility] inflammatory bowel disease clinic to see if there is something we can do to keep his bowels in better shape and if we can get his Crohn's disease under control or if it is under control he is having abdominal pain for some other reason, to approach pain management in a different fashion.    He has a history of arthralgias associated with Crohn's disease.  He has difficulty with sleep and uses Ambien.  He has a history of COPD which is stable.  He has a history of hypertension but currently off of antihypertensives.  He is lost plenty of weight over time and does not require medications for his blood pressure.    PHQ:      05/07/2023    10:20 AM   PHQ-9    Little interest or pleasure in doing things 0   Feeling down, depressed, or hopeless 0   Trouble falling or staying asleep, or sleeping too much 1   Feeling tired or having little energy 1   Poor appetite or  overeating 0   Feeling bad about yourself - or that you are a failure or have let yourself or your family down 0   Trouble concentrating on things, such as reading the newspaper or watching television 1   Moving or speaking so slowly that other people could have noticed. Or the opposite - being so fidgety or restless that you have been moving around a lot more than usual 0   Thoughts that you would be better off dead, or of hurting yourself in some way 0   PHQ-2 Score 0   PHQ-9 Total Score 3   If you checked off any problems, how difficult have these problems made it for you to do your work, take care of things at home, or get along with other people? 0       CURRENT MEDICATION LIST:    Current Outpatient Medications   Medication Sig Dispense Refill    respiratory syncytial vaccine, adjuvanted (AREXVY) 120 MCG/0.5ML injection Inject 0.5 mLs into the muscle once for 1 dose 0.5 mL 0    ALPRAZolam (XANAX) 0.5 MG tablet Take 0.5 tablet by mouth three times per day 45 tablet 5    zolpidem (AMBIEN) 10 MG tablet Take 1 tablet by  mouth nightly as needed for Sleep for up to 180 days. Max Daily Amount: 10 mg 90 tablet 1    predniSONE (DELTASONE) 10 MG tablet 4 tablets by mouth once a day for 3 days, 3 tablets by mouth once a day for 3 days, 2 tablets by mouth once a day for 3 days, 1 tablet by mouth once a day 30 tablet 0    acetaminophen (TYLENOL) 325 MG tablet Take 2 tablets by mouth every 6 hours as needed for Pain PRN      cyclobenzaprine (FLEXERIL) 10 MG tablet 0.5 tab in am and 1 tab at bedtime 20 tablet 0    Bacillus Coagulans-Inulin (PROBIOTIC FORMULA PO) Take 1 tablet by mouth daily       No current facility-administered medications for this visit.        ALLERGIES:    Allergies   Allergen Reactions    Aspirin Effervescent      Other reaction(s): Itch    Chlorphen-Phenyleph-Asa Itching     Event:      Citric Acid Swelling    Metoprolol Tartrate Itching     Event:      Sodium Bicarbonate Swelling        HISTORY:  Past  Medical History:   Diagnosis Date    Abdominal wall ulcer (HCC)     Anxiety     Crohn's disease (HCC)     Diarrhea     Gallstones     GI disease     Hypertension     Joint pain     Nausea     Pneumonia     Vomiting       Past Surgical History:   Procedure Laterality Date    CHOLECYSTECTOMY, LAPAROSCOPIC  01/03/2015    COLECTOMY  1982    OTHER SURGICAL HISTORY  04/10/2019    laparoscopic robotic assisted redo ileocolic resection with primary antiperistaltic extracorporeal anastomosis, omental nodule biopsy ([facility])      Social History     Socioeconomic History    Marital status: Unknown     Spouse name: Not on file    Number of children: Not on file    Years of education: Not on file    Highest education level: Not on file   Occupational History    Not on file   Tobacco Use    Smoking status: Every Day     Current packs/day: 0.50     Average packs/day: 0.5 packs/day for 30.0 years (15.0 ttl pk-yrs)     Types: Cigarettes    Smokeless tobacco: Never   Vaping Use    Vaping Use: Never used   Substance and Sexual Activity    Alcohol use: Not Currently    Drug use: Yes     Types: Marijuana Sheran Fava)    Sexual activity: Not on file   Other Topics Concern    Not on file   Social History Narrative    Not on file     Social Determinants of Health     Financial Resource Strain: Not on file   Food Insecurity: Not on file   Transportation Needs: Not on file   Physical Activity: Insufficiently Active (03/30/2022)    Exercise Vital Sign     Days of Exercise per Week: 7 days     Minutes of Exercise per Session: 20 min   Stress: Not on file   Social Connections: Not on file   Intimate Partner Violence: Not on file  Housing Stability: Not on file      Family History   Problem Relation Age of Onset    Diabetes Father     Hypertension Father     Lung Cancer Father 23    Diabetes Brother     Hypertension Brother     Liver Cancer Brother     Arthritis Daughter         REVIEW OF SYSTEMS:  Constitutional: negative  Eyes: negative  Ears, nose,  mouth, throat, and face: negative  Respiratory: negative  Cardiovascular: negative  Gastrointestinal: Chronic abdominal pain, worse after eating genitourinary: negative  Integument/breast: negative  Hematologic/lymphatic: negative  Musculoskeletal: Arthritis likely associated with inflammatory bowel disease  Neurological: negative  Behavioral/Psych: Chronic depression   endocrine: negative      PHYSICAL EXAM:  Vital Signs -   Visit Vitals  BP 135/80   Pulse 68   Ht 1.829 m (6')   Wt 77.8 kg (171 lb 9.6 oz)   SpO2 97%   BMI 23.27 kg/m    Body mass index is 23.27 kg/m.   General Appearance:  awake, alert, oriented, in no acute distress and well developed, well nourished  Skin: No rashes or abnormal skin lesions  Head/face: Normocephalic and atraumatic  Eyes:  PERRL, EOMI, and Sclera nonicteric  Ears:  canals and TMs NI  Nose/Sinuses:  Nares normal. Septum midline. Mucosa normal. No drainage or sinus tenderness.  Mouth/Throat:  Mucosa moist.  No lesions.  Pharynx without erythema, edema or exudate.  Neck: Supple without masses, thyromegaly, carotid bruits.  Back: Without abnormalities  Lungs:  Normal expansion.  Clear to auscultation.  No rales, rhonchi, or wheezing.  Heart:  Heart sounds are normal.  Regular rate and rhythm without murmur, gallop or rub.  Rectal: Deferred  Abdomen:  Soft, non-tender, normal bowel sounds.  No bruits, organomegaly or masses.  Extremities: Extremities warm to touch, pink, with no edema. and pulses present in all extremities  Musculoskeletal:  normal range of motion.  Peripheral vascular: pulses 2+ throughout  Neurologic:  Gait normal. Reflexes normal and symmetric. Sensation grossly intact.      LABS  No results found for this visit on 05/07/23.  Hospital Outpatient Visit on 10/23/2022   Component Date Value Ref Range Status    WBC 10/23/2022 8.0  3.8 - 10.6 x10e3/mcL Final    RBC 10/23/2022 4.31  4.00 - 5.60 x10e6/mcL Final    Hemoglobin 10/23/2022 13.6  13.0 - 17.3 g/dL Final     Hematocrit 16/06/9603 41.7  38.0 - 52.0 % Final    MCV 10/23/2022 96.8  84.0 - 100.0 fL Final    MCH 10/23/2022 31.6  27.0 - 34.5 pg Final    MCHC 10/23/2022 32.6  32.0 - 36.0 g/dL Final    RDW 54/05/8118 13.1  11.0 - 16.0 % Final    Platelets 10/23/2022 330  140 - 440 x10e3/mcL Final    MPV 10/23/2022 9.4  7.2 - 13.2 fL Final    Neutrophils % 10/23/2022 66.6  42.0 - 74.0 % Final    Lymphocytes 10/23/2022 21.6  15.0 - 45.0 % Final    Monocytes % 10/23/2022 8.5  4.0 - 12.0 % Final    Eosinophils % 10/23/2022 2.7  0.0 - 7.0 % Final    Basophils % 10/23/2022 0.5  0.0 - 2.0 % Final    Neutrophils Absolute 10/23/2022 5.4  1.6 - 7.3 x10e3/mcL Final    Lymphocytes Absolute 10/23/2022 1.7  1.0 - 3.2 x10e3/mcL  Final    Monocytes Absolute 10/23/2022 0.7  0.3 - 1.0 x10e3/mcL Final    Eosinophils Absolute 10/23/2022 0.2  0.0 - 0.5 x10e3/mcL Final    Basophils Absolute 10/23/2022 0.0  0.0 - 0.2 x10e3/mcL Final    Immature Granulocytes % 10/23/2022 0.1  0.0 - 0.6 % Final    Immature Grans (Abs) 10/23/2022 0.01  0.00 - 0.06 x10e3/mcL Final    Sodium 10/23/2022 143  135 - 145 mmol/L Final    Potassium 10/23/2022 3.8  3.5 - 5.3 mmol/L Final    Chloride 10/23/2022 108 (H)  98 - 107 mmol/L Final    CO2 10/23/2022 25  22 - 29 mmol/L Final    Glucose 10/23/2022 107 (H)  70 - 99 mg/dL Final    BUN 09/81/1914 12  8 - 23 mg/dL Final    Creatinine 78/29/5621 0.9  0.7 - 1.3 mg/dL Final    Anion Gap 30/86/5784 10  2 - 17 mmol/L Final    Osmolaliy Calculated 10/23/2022 285  270 - 287 mOsm/kg Final    Calcium 10/23/2022 8.7 (L)  8.8 - 10.2 mg/dL Final    Est, Glom Filt Rate 10/23/2022 91  >=60 mL/min/1.1m Final    Comment: VERIFIED by Discern Expert.  GFR Interpretation:                                                                         % OF  KIDNEY  GFR                                                        STAGE  FUNCTION  ==================================================================================    > 90        Normal kidney  function                       STAGE 1  90-100%  89 to 60      Mild loss of kidney function                 STAGE 2  80-60%  59 to 45      Mild to moderate loss of kidney function     STAGE 3a  59-45%  44 to 30      Moderate to severe loss of kidney function   STAGE 3b  44-30%  29 to 15      Severe loss of kidney function               STAGE 4  29-15%    < 15        Kidney failure                               STAGE 5  <15%  ==================================================================================  Modified from [employer]    GFR Calculation performed using the CKD-EPI 2021 equation developed for use  with IDMS traceable creatinine methods and  is the calculation recommended by  the [facility] for estimating GFR in adults.      CRP 10/23/2022 1.4 (H)  0.0 - 0.5 mg/dL Final       IMPRESSION/PLAN    1. Essential (primary) hypertension  ***  - CBC with Auto Differential; Future  - Comprehensive Metabolic Panel; Future  - Lipid Panel; Future  - Urinalysis W/ Rflx Microscopic; Future  - EKG 12 Lead; Future  - EKG 12 Lead    2. Medicare annual wellness visit, subsequent  ***    3. Screening PSA (prostate specific antigen)  ***  - PSA Screening; Future    4. Malaise  ***  - TSH; Future    5. Need for vaccination  ***    6. Persistent depressive disorder  ***    7. 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 Polyarthralgia  ***  - C-Reactive Protein; Future        Follow up and Dispositions:  No follow-ups on file.       Clerance Lav, MD

## 2023-05-07 NOTE — Patient Instructions (Signed)
Substance Use Disorder: Care Instructions  Overview     You can improve your life and health by stopping your use of alcohol or drugs. When you don't drink or use drugs, you may feel and sleep better. You may get along better with your family, friends, and coworkers. There are medicines and programs that can help with substance use disorder.  How can you care for yourself at home?  Here are some ways to help you stay sober and prevent relapse.  If you have been given medicine to help keep you sober or reduce your cravings, be sure to take it exactly as prescribed.  Talk to your doctor about programs that can help you stop using drugs or drinking alcohol.  Do not keep alcohol or drugs in your home.  Plan ahead. Think about what you'll say if other people ask you to drink or use drugs. Try not to spend time with people who drink or use drugs.  Use the time and money spent on drinking or drugs to do something that's important to you.  Preventing a relapse  Have a plan to deal with relapse. Learn to recognize changes in your thinking that lead you to drink or use drugs. Get help before you start to drink or use drugs again.  Try to stay away from situations, friends, or places that may lead you to drink or use drugs.  If you feel the need to drink alcohol or use drugs again, seek help right away. Call a trusted friend or family member. Some people get support from organizations such as Narcotics Anonymous or [employer] or from treatment facilities.  If you relapse, get help as soon as you can. Some people make a plan with another person that outlines what they want that person to do for them if they relapse. The plan usually includes how to handle the relapse and who to notify in case of relapse.  Don't give up. Remember that a relapse doesn't mean that you have failed. Use the experience to learn the triggers that lead you to drink or use drugs. Then quit again. Recovery is a lifelong process. Many people have  several relapses before they are able to quit for good.  Follow-up care is a key part of your treatment and safety. Be sure to make and go to all appointments, and call your doctor if you are having problems. It's also a good idea to know your test results and keep a list of the medicines you take.  When should you call for help?   Call 911  anytime you think you may need emergency care. For example, call if you or someone else:   Has overdosed or has withdrawal signs. Be sure to tell the emergency workers that you are or someone else is using or trying to quit using drugs. Overdose or withdrawal signs may include:  Losing consciousness.  Seizure.  Seeing or hearing things that aren't there (hallucinations).    Is thinking or talking about suicide or harming others.   Where to get help 24 hours a day, 7 days a week   If you or someone you know talks about suicide, self-harm, a mental health crisis, a substance use crisis, or any other kind of emotional distress, get help right away. You can:   Call the Suicide and Crisis Lifeline at 75.    Call 1-800-273-TALK ((548)168-5338).    Text HOME to 719-110-6715 to access the Crisis Text Line.   Consider saving  these numbers in your phone.  Go to 988lifeline.org for more information or to chat online.  Call your doctor now or seek immediate medical care if:   You are having withdrawal symptoms. These may include nausea or vomiting, sweating, shakiness, and anxiety.   Watch closely for changes in your health, and be sure to contact your doctor if:   You have a relapse.    You need more help or support to stop.   Where can you learn more?  Go to [employer] and enter H573 to learn more about "Substance Use Disorder: Care Instructions."  Current as of: August 12, 2022  Content Version: 14.1   2006-2024 [employer].   Care instructions adapted under license by [facility]. If you have questions about a medical condition or this  instruction, always ask your healthcare professional. [employer] Inc[employer] disclaims any warranty or liability for your use of this information.           A Healthy Heart: Care Instructions  Overview     Coronary artery disease, also called heart disease, occurs when a substance called plaque builds up in the vessels that supply oxygen-rich blood to your heart muscle. This can narrow the blood vessels and reduce blood flow. A heart attack happens when blood flow is completely blocked. A high-fat diet, smoking, and other factors increase the risk of heart disease.  Your doctor has found that you have a chance of having heart disease. A heart-healthy lifestyle can help keep your heart healthy and prevent heart disease. This lifestyle includes eating healthy, being active, staying at a weight that's healthy for you, and not smoking or using tobacco. It also includes taking medicines as directed, managing other health conditions, and trying to get a healthy amount of sleep.  Follow-up care is a key part of your treatment and safety. Be sure to make and go to all appointments, and call your doctor if you are having problems. It's also a good idea to know your test results and keep a list of the medicines you take.  How can you care for yourself at home?  Diet   Use less salt when you cook and eat. This helps lower your blood pressure. Taste food before salting. Add only a little salt when you think you need it. With time, your taste buds will adjust to less salt.    Eat fewer snack items, fast foods, canned soups, and other high-salt, high-fat, processed foods.    Read food labels and try to avoid saturated and trans fats. They increase your risk of heart disease by raising cholesterol levels.    Limit the amount of solid fat--butter, margarine, and shortening--you eat. Use olive, peanut, or canola oil when you cook. Bake, broil, and steam foods instead of frying them.    Eat a variety of fruit and vegetables  every day. Dark green, deep orange, red, or yellow fruits and vegetables are especially good for you. Examples include spinach, carrots, peaches, and berries.    Foods high in fiber can reduce your cholesterol and provide important vitamins and minerals. High-fiber foods include whole-grain cereals and breads, oatmeal, beans, brown rice, citrus fruits, and apples.    Eat lean proteins. Heart-healthy proteins include seafood, lean meats and poultry, eggs, beans, peas, nuts, seeds, and soy products.    Limit drinks and foods with added sugar. These include candy, desserts, and soda pop.   Heart-healthy lifestyle   If your doctor recommends it, get more exercise. For  many people, walking is a good choice. Or you may want to swim, bike, or do other activities. Bit by bit, increase the time you're active every day. Try for at least 30 minutes on most days of the week.    Try to quit or cut back on using tobacco and other nicotine products. This includes smoking and vaping. If you need help quitting, talk to your doctor about stop-smoking programs and medicines. These can increase your chances of quitting for good. Quitting is one of the most important things you can do to protect your heart. It is never too late to quit. Try to avoid secondhand smoke too.    Stay at a weight that's healthy for you. Talk to your doctor if you need help losing weight.    Try to get 7 to 9 hours of sleep each night.    Limit alcohol to 2 drinks a day for men and 1 drink a day for women. Too much alcohol can cause health problems.    Manage other health problems such as diabetes, high blood pressure, and high cholesterol. If you think you may have a problem with alcohol or drug use, talk to your doctor.   Medicines   Take your medicines exactly as prescribed. Call your doctor if you think you are having a problem with your medicine.    If your doctor recommends aspirin, take the amount directed each day. Make sure you take aspirin  and not another kind of pain reliever, such as acetaminophen (Tylenol).   When should you call for help?   Call 911 if you have symptoms of a heart attack. These may include:   Chest pain or pressure, or a strange feeling in the chest.    Sweating.    Shortness of breath.    Pain, pressure, or a strange feeling in the back, neck, jaw, or upper belly or in one or both shoulders or arms.    Lightheadedness or sudden weakness.    A fast or irregular heartbeat.   After you call 911, the operator may tell you to chew 1 adult-strength or 2 to 4 low-dose aspirin. Wait for an ambulance. Do not try to drive yourself.  Watch closely for changes in your health, and be sure to contact your doctor if you have any problems.  Where can you learn more?  Go to [employer] and enter F075 to learn more about "A Healthy Heart: Care Instructions."  Current as of: March 21, 2022  Content Version: 14.1   2006-2024 [employer], [employer].   Care instructions adapted under license by [facility]. If you have questions about a medical condition or this instruction, always ask your healthcare professional. Healthwise, Inc[employer] disclaims any warranty or liability for your use of this information.      Personalized Preventive Plan for [name] - 05/07/2023  Medicare offers a range of preventive health benefits. Some of the tests and screenings are paid in full while other may be subject to a deductible, co-insurance, and/or copay.    Some of these benefits include a comprehensive review of your medical history including lifestyle, illnesses that may run in your family, and various assessments and screenings as appropriate.    After reviewing your medical record and screening and assessments performed today your provider may have ordered immunizations, labs, imaging, and/or referrals for you.  A list of these orders (if applicable) as well as your Preventive Care list are included within your After  Visit Summary  for your review.    Other Preventive Recommendations:    A preventive eye exam performed by an eye specialist is recommended every 1-2 years to screen for glaucoma; cataracts, macular degeneration, and other eye disorders.  A preventive dental visit is recommended every 6 months.  Try to get at least 150 minutes of exercise per week or 10,000 steps per day on a pedometer .  Order or download the FREE "Exercise & Physical Activity: Your Everyday Guide" from [employer]. Call 9525478000 or search [employer] online.  You need 1200-1500 mg of calcium and 1000-2000 IU of vitamin D per day. It is possible to meet your calcium requirement with diet alone, but a vitamin D supplement is usually necessary to meet this goal.  When exposed to the sun, use a sunscreen that protects against both UVA and UVB radiation with an SPF of 30 or greater. Reapply every 2 to 3 hours or after sweating, drying off with a towel, or swimming.  Always wear a seat belt when traveling in a car. Always wear a helmet when riding a bicycle or motorcycle.

## 2023-05-10 ENCOUNTER — Encounter

## 2023-05-10 MED ORDER — ALPRAZOLAM 0.5 MG PO TABS
0.5 MG | ORAL_TABLET | ORAL | 5 refills | Status: DC
Start: 2023-05-10 — End: 2024-01-27

## 2023-05-10 MED ORDER — ZOLPIDEM TARTRATE 10 MG PO TABS
10 MG | ORAL_TABLET | Freq: Every evening | ORAL | 1 refills | Status: DC | PRN
Start: 2023-05-10 — End: 2024-01-27

## 2023-05-10 MED ORDER — ALPRAZOLAM 0.5 MG PO TABS
0.5 | ORAL_TABLET | ORAL | 5 refills | Status: DC
Start: 2023-05-10 — End: 2024-04-07

## 2023-05-10 NOTE — Telephone Encounter (Signed)
Refill    Xanax 0.5 MG - 3 times a day as needed - 90 day supply     [id number] [phone]    Sent to [facility]

## 2023-05-10 NOTE — Telephone Encounter (Signed)
Loaded in     [facility] website checked today  Xanax 0.5 mg last filled on 11/17/2022 # 45 for a 30 day supply    Last seen: 05/07/2023  Sch: 05/12/2024    Sent to Dr Ilsa Iha

## 2023-05-10 NOTE — Telephone Encounter (Signed)
I sent in alprazolam.  Let him

## 2023-06-16 ENCOUNTER — Encounter

## 2023-06-16 MED ORDER — CYANOCOBALAMIN 1000 MCG/ML IJ SOLN
1000 | INTRAMUSCULAR | 3 refills | Status: DC
Start: 2023-06-16 — End: 2024-03-10

## 2023-06-16 MED ORDER — CYANOCOBALAMIN 1000 MCG/ML IJ SOLN
1000 | INTRAMUSCULAR | 2 refills | 60.00000 days | Status: DC
Start: 2023-06-16 — End: 2024-03-20

## 2023-06-16 NOTE — Telephone Encounter (Signed)
 Called and let the patient know  Ordered the labs for 3 months and printed off and  mailed the orders to him  Loaded in the prescriptions    Please send in

## 2023-06-16 NOTE — Telephone Encounter (Signed)
 Tell him I reviewed some labs from [facility].  His vitamin B12 level is low.  Lets have him start taking vitamin B12 1000 mcg IM weekly for 4 weeks then monthly after that.  Also vitamin B12 500 mcg orally daily indefinitely as a supplement.  Lets recheck a vitamin B12 level and methylmalonic acid level in 3 months diagnosis includes vitamin B12 deficiency

## 2023-08-06 NOTE — Telephone Encounter (Signed)
Patient has lost his wallet and he has a gi appointment on monday. [name] would like to know if he can be emailed a copy of is insurance card.     [employer] MEDICARE    EMAIL ADDRESS : [id number]  AND :                       [name]

## 2023-08-06 NOTE — Telephone Encounter (Signed)
Called the patient's wife and let her know that I will email the most recent insurance cards I have from 2022 to them

## 2023-08-06 NOTE — Telephone Encounter (Signed)
On call dr Janee Morn

## 2024-01-10 NOTE — Telephone Encounter (Signed)
 Called into [facility] and let the patient's wife know

## 2024-01-10 NOTE — Telephone Encounter (Signed)
 Voicemail : 1:20pm    Please call and advise     --wife Mollie Anger called: she states they are going out of town Thursday, Friday and Saturday  --will Dr Artemio Rumaldo give him a prescription so that he will not have abdominal pain while they are away    [facility]   [phone]    Sent to [facility]

## 2024-01-10 NOTE — Telephone Encounter (Signed)
 Wants Prednisone

## 2024-01-10 NOTE — Telephone Encounter (Signed)
 Call in a prednisone taper.  10 mg.  #30.  4 tablets a day for 3 days then 3 tablets a day for 3 days then 2 tablets a day for 3 days then 1 tablet a day for 3 days.  Let him know

## 2024-01-27 ENCOUNTER — Encounter

## 2024-01-27 MED ORDER — ALPRAZOLAM 0.5 MG PO TABS
0.5 | ORAL_TABLET | ORAL | 5 refills | 30.00000 days | Status: DC
Start: 2024-01-27 — End: 2024-04-07

## 2024-01-27 MED ORDER — ZOLPIDEM TARTRATE 10 MG PO TABS
10 | ORAL_TABLET | Freq: Every evening | ORAL | 1 refills | 30.00000 days | Status: DC | PRN
Start: 2024-01-27 — End: 2024-09-06

## 2024-01-27 NOTE — Telephone Encounter (Signed)
 Refill    zolpidem  (AMBIEN ) 10 MG tablet -  Take 1 tablet by mouth nightly as needed for Sleep for up to 180 days - 90 tablet    Requested Prescriptions     Pending Prescriptions Disp Refills    ALPRAZolam  (XANAX ) 0.5 MG tablet 45 tablet 5     Sig: Take 0.5 tablet by mouth three times per day     [id number] [phone]    On board for Dr. Hildy Lowers - Sent to [facility]

## 2024-01-27 NOTE — Telephone Encounter (Signed)
 Last ov 05/07/2023  Scheduled 05/12/24  Ambien  last filled 08/31/24 #90  Xanax  last filled 08/27/23 #45

## 2024-01-27 NOTE — Telephone Encounter (Signed)
 I sent in meds.  Let him know

## 2024-03-10 ENCOUNTER — Encounter

## 2024-03-10 MED ORDER — CYANOCOBALAMIN 1000 MCG/ML IJ SOLN
1000 | INTRAMUSCULAR | 3 refills | 30.00000 days | Status: DC
Start: 2024-03-10 — End: 2024-09-18

## 2024-03-10 NOTE — Telephone Encounter (Signed)
 Pt's wife in office requested refill. Please advise

## 2024-03-20 ENCOUNTER — Encounter

## 2024-03-20 MED ORDER — CYANOCOBALAMIN 1000 MCG/ML IJ SOLN
1000 | INTRAMUSCULAR | 2 refills | 30.00000 days | Status: DC
Start: 2024-03-20 — End: 2024-09-18

## 2024-03-20 NOTE — Telephone Encounter (Signed)
 I sent in B12.  Let him know

## 2024-04-07 ENCOUNTER — Encounter

## 2024-04-07 MED ORDER — ALPRAZOLAM 0.5 MG PO TABS
0.5 | ORAL_TABLET | ORAL | 5 refills | 30.00000 days | Status: AC
Start: 2024-04-07 — End: 2026-04-04

## 2024-04-07 MED ORDER — ALPRAZOLAM 0.5 MG PO TABS
0.5 | ORAL_TABLET | ORAL | 5 refills | 30.00000 days | Status: AC
Start: 2024-04-07 — End: 2026-04-01

## 2024-04-07 NOTE — Telephone Encounter (Signed)
 [facility] website checked today  Xanax  0.5 mg last filled on 01/27/2024 # 45 for a 30 day supply    Sent to Dr Juli

## 2024-04-07 NOTE — Telephone Encounter (Signed)
I resent.  Let her know

## 2024-04-07 NOTE — Telephone Encounter (Signed)
 I sent in alprazolam.  Let him know

## 2024-04-07 NOTE — Telephone Encounter (Signed)
Re-loaded in  Please re send in

## 2024-05-11 NOTE — Telephone Encounter (Signed)
 Please cancel pt's 05/12/24 appt, states that she will callback when she is able to bring him.     Sent to [facility]

## 2024-05-12 ENCOUNTER — Encounter: Payer: MEDICARE | Attending: Internal Medicine | Primary: Internal Medicine

## 2024-05-12 NOTE — Telephone Encounter (Signed)
Cancelled the appt

## 2024-06-21 ENCOUNTER — Encounter

## 2024-06-21 NOTE — Telephone Encounter (Signed)
 Last appt 05/10/23 - he needs to schedule follow up.

## 2024-07-20 ENCOUNTER — Encounter: Attending: Internal Medicine | Primary: Internal Medicine

## 2024-07-20 ENCOUNTER — Telehealth: Admit: 2024-07-20 | Discharge: 2024-07-20 | Payer: MEDICARE | Attending: Internal Medicine | Primary: Internal Medicine

## 2024-07-20 DIAGNOSIS — N3000 Acute cystitis without hematuria: Principal | ICD-10-CM

## 2024-07-20 MED ORDER — PREDNISONE 10 MG PO TABS
10 | ORAL_TABLET | ORAL | 0 refills | 5.00000 days | Status: AC
Start: 2024-07-20 — End: ?

## 2024-07-20 MED ORDER — SULFAMETHOXAZOLE-TRIMETHOPRIM 800-160 MG PO TABS
800-160 | ORAL_TABLET | Freq: Two times a day (BID) | ORAL | 0 refills | 7.00000 days | Status: AC
Start: 2024-07-20 — End: 2024-07-27

## 2024-07-20 NOTE — Telephone Encounter (Signed)
"  Sched 3:45  "

## 2024-07-20 NOTE — Telephone Encounter (Signed)
"    Please call to schedule an appt    --he has abdominal pain for  a couple of days  --it is in the upper quadrant  --the sharp pain comes and goes  --he has diarrhea  --the pain is worse after eating but he has pain all the time-- only time he gets any relief is when he lays down  --nausea comes and goes  --he has pain down the middle of the shaft of the penis- day 3--he has pain  when urinating  and a burning pain when he is not urinating  --no fever    [facility]  [phone]    Sent to [facility]  "

## 2024-07-20 NOTE — Telephone Encounter (Signed)
"  Patient will not be able to make it to his appointment today. She states he is too close to the bathroom.     She is on her way.   "

## 2024-07-20 NOTE — Assessment & Plan Note (Signed)
" {  A/P [phone]::***}    Orders:    predniSONE  (DELTASONE ) 10 MG tablet; 4 tablets by mouth once a day for 3 days, 3 tablets by mouth once a day for 3 days, 2 tablets by mouth once a day for 3 days, 1 tablet by mouth once a day    sulfamethoxazole -trimethoprim  (BACTRIM  DS;SEPTRA  DS) 800-160 MG per tablet; Take 1 tablet by mouth 2 times daily for 7 days    "

## 2024-07-20 NOTE — Telephone Encounter (Signed)
Tell him to come in now!

## 2024-07-20 NOTE — Progress Notes (Signed)
"      [facility], was evaluated through a synchronous (real-time) audio-video encounter. The patient (or guardian if applicable) is aware that this is a billable service, which includes applicable co-pays. This Virtual Visit was conducted with patient's (and/or legal guardian's) consent. Patient identification was verified, and a caregiver was present when appropriate.   The patient was located at Home: [address]  [place] [address]  Provider was located at Facility (Appt Dept): [address]  [address]  [place],  [place] [address]  Confirm you are appropriately licensed, registered, or certified to deliver care in the state where the patient is located as indicated above. If you are not or unsure, please re-schedule the visit: Yes, I confirm.     [name] (DOB:  06-11-1951) is a Established patient, presenting virtually for evaluation of the following:      Below is the assessment and plan developed based on review of pertinent history, physical exam, labs, studies, and medications.     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       Subjective   HPI  Review of Systems       Objective   Patient-Reported Vitals  No data recorded     Physical Exam             --Louis JONETTA Bob, MA         "

## 2024-07-20 NOTE — Progress Notes (Signed)
 "CHIEF COMPLAINT:  No chief complaint on file.       HISTORY OF PRESENT ILLNESS:  Mr. Couper is a 73 y.o. male  who presents by virtual visit using visual and audio.  Patient was unable to come in today secondary to illness.  He is having urinary frequency and burning with urination.  Been going on for several days.    He has documented well-defined Crohn's disease which is frequently active.  He requested getting a prescription for some prednisone  in the event that taking antibiotics for his urinary tract infection flares up his Crohn's disease.    PHQ:      05/07/2023    10:20 AM   PHQ-9    Little interest or pleasure in doing things 0   Feeling down, depressed, or hopeless 0   Trouble falling or staying asleep, or sleeping too much 1   Feeling tired or having little energy 1   Poor appetite or overeating 0   Feeling bad about yourself - or that you are a failure or have let yourself or your family down 0   Trouble concentrating on things, such as reading the newspaper or watching television 1   Moving or speaking so slowly that other people could have noticed. Or the opposite - being so fidgety or restless that you have been moving around a lot more than usual 0   Thoughts that you would be better off dead, or of hurting yourself in some way 0   PHQ-2 Score 0    PHQ-9 Total Score 3    If you checked off any problems, how difficult have these problems made it for you to do your work, take care of things at home, or get along with other people? 0       Data saved with a previous flowsheet row definition       CURRENT MEDICATION LIST:    Current Outpatient Medications   Medication Sig Dispense Refill    predniSONE  (DELTASONE ) 10 MG tablet 4 tablets by mouth once a day for 3 days, 3 tablets by mouth once a day for 3 days, 2 tablets by mouth once a day for 3 days, 1 tablet by mouth once a day 30 tablet 0    sulfamethoxazole -trimethoprim  (BACTRIM  DS;SEPTRA  DS) 800-160 MG per tablet Take 1 tablet by mouth 2 times daily  for 7 days 14 tablet 0    ALPRAZolam  (XANAX ) 0.5 MG tablet Take 0.5 tablet by mouth three times per day 45 tablet 5    ALPRAZolam  (XANAX ) 0.5 MG tablet Take 0.5 tablet by mouth three times per day 45 tablet 5    cyanocobalamin  1000 MCG/ML injection Inject 1 mL into the muscle every 30 days 1 mL 2    cyanocobalamin  1000 MCG/ML injection Inject 1 mL into the muscle every 7 days 1 mL 3    zolpidem  (AMBIEN ) 10 MG tablet Take 1 tablet by mouth nightly as needed for Sleep for up to 180 days. Max Daily Amount: 10 mg 90 tablet 1    predniSONE  (DELTASONE ) 10 MG tablet 4 tablets by mouth once a day for 3 days, 3 tablets by mouth once a day for 3 days, 2 tablets by mouth once a day for 3 days, 1 tablet by mouth once a day 30 tablet 0    acetaminophen (TYLENOL) 325 MG tablet Take 2 tablets by mouth every 6 hours as needed for Pain PRN      cyclobenzaprine  (FLEXERIL ) 10 MG tablet  0.5 tab in am and 1 tab at bedtime 20 tablet 0    Bacillus Coagulans-Inulin (PROBIOTIC FORMULA PO) Take 1 tablet by mouth daily       No current facility-administered medications for this visit.        ALLERGIES:    Allergies   Allergen Reactions    Aspirin Effervescent      Other reaction(s): Itch    Chlorphen-Phenyleph-Asa Itching     Event:      Citric Acid Swelling    Metoprolol Tartrate Itching     Event:      Sodium Bicarbonate Swelling        HISTORY:  Past Medical History:   Diagnosis Date    Abdominal wall ulcer (HCC)     Anxiety     Crohn's disease (HCC)     Diarrhea     Gallstones     GI disease     Hypertension     Joint pain     Nausea     Pneumonia     Vomiting       Past Surgical History:   Procedure Laterality Date    CHOLECYSTECTOMY, LAPAROSCOPIC  01/03/2015    COLECTOMY  1982    OTHER SURGICAL HISTORY  04/10/2019    laparoscopic robotic assisted redo ileocolic resection with primary antiperistaltic extracorporeal anastomosis, omental nodule biopsy ([facility])      Social History     Socioeconomic History    Marital status: Unknown      Spouse name: Not on file    Number of children: Not on file    Years of education: Not on file    Highest education level: Not on file   Occupational History    Not on file   Tobacco Use    Smoking status: Every Day     Current packs/day: 0.50     Average packs/day: 0.5 packs/day for 30.0 years (15.0 ttl pk-yrs)     Types: Cigarettes    Smokeless tobacco: Never   Vaping Use    Vaping status: Never Used   Substance and Sexual Activity    Alcohol use: Not Currently    Drug use: Yes     Types: Marijuana Oda)    Sexual activity: Not on file   Other Topics Concern    Not on file   Social History Narrative    Not on file     Social Drivers of Health     Financial Resource Strain: Not on file   Food Insecurity: Not on file   Transportation Needs: Not on file   Physical Activity: Inactive (05/07/2023)    Exercise Vital Sign     Days of Exercise per Week: 0 days     Minutes of Exercise per Session: 0 min   Stress: Not on file   Social Connections: Not on file   Intimate Partner Violence: Not on file   Housing Stability: Not on file      Family History   Problem Relation Age of Onset    Diabetes Father     Hypertension Father     Lung Cancer Father 9    Diabetes Brother     Hypertension Brother     Liver Cancer Brother     Arthritis Daughter         REVIEW OF SYSTEMS:  Pertinent items are noted in HPI.    PHYSICAL EXAM:  Vital Signs -   There were no vitals taken for this visit.  There is no height or weight on file to calculate BMI.   General Appearance:  awake, alert, oriented, in no acute distress and well developed, well nourished    LABS  No results found for this visit on 07/20/24.  Hospital Outpatient Visit on 10/23/2022   Component Date Value Ref Range Status    WBC 10/23/2022 8.0  3.8 - 10.6 x10e3/mcL Final    RBC 10/23/2022 4.31  4.00 - 5.60 x10e6/mcL Final    Hemoglobin 10/23/2022 13.6  13.0 - 17.3 g/dL Final    Hematocrit 98/73/7975 41.7  38.0 - 52.0 % Final    MCV 10/23/2022 96.8  84.0 - 100.0 fL Final    MCH  10/23/2022 31.6  27.0 - 34.5 pg Final    MCHC 10/23/2022 32.6  32.0 - 36.0 g/dL Final    RDW 98/73/7975 13.1  11.0 - 16.0 % Final    Platelets 10/23/2022 330  140 - 440 x10e3/mcL Final    MPV 10/23/2022 9.4  7.2 - 13.2 fL Final    Neutrophils % 10/23/2022 66.6  42.0 - 74.0 % Final    Lymphocytes 10/23/2022 21.6  15.0 - 45.0 % Final    Monocytes % 10/23/2022 8.5  4.0 - 12.0 % Final    Eosinophils % 10/23/2022 2.7  0.0 - 7.0 % Final    Basophils % 10/23/2022 0.5  0.0 - 2.0 % Final    Neutrophils Absolute 10/23/2022 5.4  1.6 - 7.3 x10e3/mcL Final    Lymphocytes Absolute 10/23/2022 1.7  1.0 - 3.2 x10e3/mcL Final    Monocytes Absolute 10/23/2022 0.7  0.3 - 1.0 x10e3/mcL Final    Eosinophils Absolute 10/23/2022 0.2  0.0 - 0.5 x10e3/mcL Final    Basophils Absolute 10/23/2022 0.0  0.0 - 0.2 x10e3/mcL Final    Immature Granulocytes % 10/23/2022 0.1  0.0 - 0.6 % Final    Immature Grans (Abs) 10/23/2022 0.01  0.00 - 0.06 x10e3/mcL Final    Sodium 10/23/2022 143  135 - 145 mmol/L Final    Potassium 10/23/2022 3.8  3.5 - 5.3 mmol/L Final    Chloride 10/23/2022 108 (H)  98 - 107 mmol/L Final    CO2 10/23/2022 25  22 - 29 mmol/L Final    Glucose 10/23/2022 107 (H)  70 - 99 mg/dL Final    BUN 98/73/7975 12  8 - 23 mg/dL Final    Creatinine 98/73/7975 0.9  0.7 - 1.3 mg/dL Final    Anion Gap 98/73/7975 10  2 - 17 mmol/L Final    Osmolaliy Calculated 10/23/2022 285  270 - 287 mOsm/kg Final    Calcium 10/23/2022 8.7 (L)  8.8 - 10.2 mg/dL Final    Est, Glom Filt Rate 10/23/2022 91  >=60 mL/min/1.31m Final    Comment: VERIFIED by Discern Expert.  GFR Interpretation:                                                                         % OF  KIDNEY  GFR  STAGE  FUNCTION  ==================================================================================    > 90        Normal kidney function                       STAGE 1  90-100%  89 to 60      Mild loss of kidney function                  STAGE 2  80-60%  59 to 45      Mild to moderate loss of kidney function     STAGE 3a  59-45%  44 to 30      Moderate to severe loss of kidney function   STAGE 3b  44-30%  29 to 15      Severe loss of kidney function               STAGE 4  29-15%    < 15        Kidney failure                               STAGE 5  <15%  ==================================================================================  Modified from [employer]    GFR Calculation performed using the CKD-EPI 2021 equation developed for use  with IDMS traceable creatinine methods and                            is the calculation recommended by  the [facility] for estimating GFR in adults.      CRP 10/23/2022 1.4 (H)  0.0 - 0.5 mg/dL Final       IMPRESSION/PLAN    1. Acute cystitis without hematuria  Patient with symptoms of acute cystitis.  Will treat with Bactrim  DS 1 twice daily for 10 days.    2. 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  Going to treat with Bactrim .  Will give him a prescription for prednisone  in the event that this flares up his colon.        Follow up and Dispositions:  Return for As scheduled.       Elsie VEAR Juli DOUGLAS, MD    "

## 2024-08-28 MED ORDER — CYANOCOBALAMIN 1000 MCG/ML IJ SOLN
1000 | INTRAMUSCULAR | 2 refills | 30.00000 days | Status: DC
Start: 2024-08-28 — End: 2024-09-18

## 2024-08-28 NOTE — Telephone Encounter (Signed)
"  I LET PT KNOW  "

## 2024-08-28 NOTE — Telephone Encounter (Signed)
"  He needs to update labs and schedule follow up.  Needs physical labs and B12.  Please call him.  Rx sent.  "

## 2024-09-06 ENCOUNTER — Encounter

## 2024-09-06 MED ORDER — ZOLPIDEM TARTRATE 10 MG PO TABS
10 | ORAL_TABLET | Freq: Every evening | ORAL | 1 refills | 30.00000 days | Status: DC | PRN
Start: 2024-09-06 — End: 2024-10-03

## 2024-09-06 NOTE — Telephone Encounter (Signed)
"  I sent in Ambien .  Let him know  "

## 2024-09-06 NOTE — Telephone Encounter (Signed)
"  Loaded in  [facility] website checked today  Zolpidem  10 mg last filled on 05/09/2024 # 90 for a 90 day supply    Last seen: 07/20/2024  No appt sch    Sent to Dr Juli  "

## 2024-09-06 NOTE — Telephone Encounter (Signed)
"  Refill    zolpidem  (AMBIEN ) 10 MG tablet - Take 1 tablet by mouth nightly as needed for Sleep for up to 180 days. -  90 tablet (90 day supply)    [id number]  [phone]    Sent to [facility]   "

## 2024-09-18 ENCOUNTER — Telehealth

## 2024-09-18 MED ORDER — CYANOCOBALAMIN 1000 MCG/ML IJ SOLN
1000 | INTRAMUSCULAR | 2 refills | 30.00000 days | Status: AC
Start: 2024-09-18 — End: ?

## 2024-09-18 NOTE — Telephone Encounter (Signed)
"  Please call and advise      --wife would like to know if he should get bloodwork done      -also does Dr Juli want him to r/s his physical he missed?     -- should her husband  be taking the B-12 injection every  28 days or  once a week ( the once a week was on the last prescription they picked up     Sent to [facility]  "

## 2024-09-18 NOTE — Telephone Encounter (Signed)
"  Okay to order full physical blood work plus vitamin B12 and methylmalonic acid level.  Diagnosis is B12 deficiency.  Should be taking B12 injections once a month.  Schedule him for a physical with me when available.  "

## 2024-09-18 NOTE — Telephone Encounter (Signed)
"  Called and left a message for the patient's wife letting her know to call back to get his physical rescheduled.  Ordered the labs  Loaded the refill for the monthly B-12  Please send in  "

## 2024-10-03 ENCOUNTER — Encounter

## 2024-10-03 MED ORDER — ZOLPIDEM TARTRATE 10 MG PO TABS
10 | ORAL_TABLET | Freq: Every evening | ORAL | 1 refills | 30.00000 days | Status: AC | PRN
Start: 2024-10-03 — End: 2025-04-01

## 2024-10-03 NOTE — Telephone Encounter (Signed)
 [facility] website checked today  Ambien  10 mg last filled on 05/18/2024 # 90 for a 90 day supply    Sent to Dr Juli

## 2024-10-03 NOTE — Telephone Encounter (Signed)
 [facility]'s ambien  medication 10 mg 90 day supply to be refilled

## 2024-10-03 NOTE — Telephone Encounter (Signed)
"  I sent in Ambien .  Let him know  "
